# Patient Record
Sex: Female | Born: 1960 | Race: Black or African American | Hispanic: No | Marital: Single | Smoking: Current every day smoker
Health system: Southern US, Community
[De-identification: ages and names within clinical notes are randomized; demographics above are authoritative.]

## PROBLEM LIST (undated history)

## (undated) DIAGNOSIS — I209 Angina pectoris, unspecified: Secondary | ICD-10-CM

## (undated) DIAGNOSIS — J189 Pneumonia, unspecified organism: Secondary | ICD-10-CM

## (undated) DIAGNOSIS — R0602 Shortness of breath: Secondary | ICD-10-CM

## (undated) HISTORY — PX: COLON SURGERY: SHX602

---

## 1994-01-11 HISTORY — PX: SALPINGOOPHORECTOMY: SHX82

## 1999-08-19 ENCOUNTER — Emergency Department (HOSPITAL_COMMUNITY): Admission: EM | Admit: 1999-08-19 | Discharge: 1999-08-19 | Payer: Self-pay | Admitting: *Deleted

## 1999-09-02 ENCOUNTER — Emergency Department (HOSPITAL_COMMUNITY): Admission: EM | Admit: 1999-09-02 | Discharge: 1999-09-02 | Payer: Self-pay

## 1999-09-04 ENCOUNTER — Encounter: Payer: Self-pay | Admitting: Emergency Medicine

## 1999-09-04 ENCOUNTER — Inpatient Hospital Stay (HOSPITAL_COMMUNITY): Admission: EM | Admit: 1999-09-04 | Discharge: 1999-09-10 | Payer: Self-pay | Admitting: Emergency Medicine

## 1999-09-04 ENCOUNTER — Encounter (INDEPENDENT_AMBULATORY_CARE_PROVIDER_SITE_OTHER): Payer: Self-pay | Admitting: Specialist

## 1999-09-12 HISTORY — PX: APPENDECTOMY: SHX54

## 2003-06-14 ENCOUNTER — Inpatient Hospital Stay (HOSPITAL_COMMUNITY): Admission: EM | Admit: 2003-06-14 | Discharge: 2003-06-18 | Payer: Self-pay | Admitting: Emergency Medicine

## 2003-06-14 ENCOUNTER — Encounter: Payer: Self-pay | Admitting: Emergency Medicine

## 2006-02-13 ENCOUNTER — Emergency Department (HOSPITAL_COMMUNITY): Admission: EM | Admit: 2006-02-13 | Discharge: 2006-02-14 | Payer: Self-pay | Admitting: Emergency Medicine

## 2006-02-26 ENCOUNTER — Emergency Department (HOSPITAL_COMMUNITY): Admission: EM | Admit: 2006-02-26 | Discharge: 2006-02-26 | Payer: Self-pay | Admitting: Emergency Medicine

## 2006-09-06 ENCOUNTER — Emergency Department (HOSPITAL_COMMUNITY): Admission: EM | Admit: 2006-09-06 | Discharge: 2006-09-06 | Payer: Self-pay | Admitting: Emergency Medicine

## 2007-10-17 ENCOUNTER — Emergency Department (HOSPITAL_COMMUNITY): Admission: EM | Admit: 2007-10-17 | Discharge: 2007-10-18 | Payer: Self-pay | Admitting: Emergency Medicine

## 2007-12-14 ENCOUNTER — Emergency Department (HOSPITAL_COMMUNITY): Admission: EM | Admit: 2007-12-14 | Discharge: 2007-12-14 | Payer: Self-pay | Admitting: Emergency Medicine

## 2009-01-16 ENCOUNTER — Emergency Department (HOSPITAL_COMMUNITY): Admission: EM | Admit: 2009-01-16 | Discharge: 2009-01-16 | Payer: Self-pay | Admitting: Emergency Medicine
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
## 2010-05-29 NOTE — H&P (Signed)
[facility]  Patient:    Connie Rogers, Connie Rogers                  MRN: [id number] Adm. Date:  [id number] Attending:  Fortino Sic CC:         Willey Blade, M.D.   History and Physical  CHIEF COMPLAINT:  Abdominal pain.  HISTORY OF PRESENT ILLNESS:  This is a 50 year old female with abdominal pain of approximately four days, that is associated with nausea and vomiting.  The pain started in both lower quadrants and is basically on the right side and suprapubically.  There is marked anorexia with vomiting, some fevers, chills, and sweats.  CT was performed and reveals a 5 x 6 x 7 cm pelvic inflammatory mass, likely an abscess, cannot rule out appendiceal versus ovarian abscess. A small amount of free pelvic fluid.  Laboratory work reveals an elevated white count of 20,000, with a shift to the left.  PAST MEDICAL HISTORY:  The patient has had PID, thought to be due to gonorrhea, and has had a diagnosis of gonorrhea.  PAST SURGICAL HISTORY:  She has had a tubal pregnancy and a C-section.  SOCIAL HISTORY:  Cigarettes, one pack per day.  Alcohol daily, whiskey intake.  REVIEW OF SYSTEMS:  Otherwise negative.  FAMILY HISTORY:  Not contributory.  PHYSICAL EXAMINATION:  VITAL SIGNS:  Temperature 99.9, pulse 104, respirations 20, blood pressure 106/63.  GENERAL:  Well-developed, slightly obese, in moderate abdominal pain.  HEENT:  Normal.  NECK:  Supple.  CHEST:  Clear.  HEART:  Regular rhythm without murmurs, rubs, or gallops.  ABDOMEN:  Tender, particularly in the right lower quadrant and suprapubically. Rovsings sign is positive.  PELVIC:  Pelvic-rectal exam was performed by a gynecologist, and I did not repeat.  EXTREMITIES:  Within normal limits.  NEUROLOGIC:  Within normal limits.  SKIN:  Within normal limits.  ADMITTING IMPRESSION:  Pelvic abscess, rule out acute appendicitis. DD:  09/04/99 TD:  09/05/99 Job: [id number]

## 2010-05-29 NOTE — Op Note (Signed)
[facility]  Patient:    Connie Rogers, Connie Rogers                  MRN: [id number] Proc. Date: 08/31/99 Adm. Date:  [id number] Attending:  Donne Hazel                           Operative Report  PREOPERATIVE DIAGNOSIS:  Acute abdominal pain.  POSTOPERATIVE DIAGNOSIS:  Right tuboovarian abscess.  PROCEDURES: 1. Laparotomy. 2. Right salpingo-oophorectomy. 3. Appendectomy per Dr. Wiliam Ke.  SURGEON:  Willey Blade, M.D.  ASSISTANT:  Marnee Spring. Wiliam Ke, M.D.  ANESTHESIA:  General endotracheal.  ESTIMATED BLOOD LOSS:  100 cc.  COMPLICATIONS:  None.  FINDINGS:  At time of surgery, a large right tuboovarian complex was noted. This was about 7 cm in size and purulent.  There was purulence upon entry into the abdominal cavity.  The uterus was normal in size and configuration.  The left ovary was normal as well.  The left tube was absent.  DESCRIPTION OF PROCEDURE:  The patient was taken to the operating room where a general endotracheal anesthetic was administered.  The patient was placed on the operating table in the supine position.  The abdomen was prepped and draped in the usual sterile fashion with Betadine and sterile drapes.  A Foley catheter was sterilely inserted.  The abdomen was entered through a vertical incision extending from the suprapubic area to the umbilicus.  This was carried down sharply in the usual fashion.  The peritoneum was atraumatically entered.  Upon entry into the peritoneal cavity, purulence was encountered. First appendectomy was performed by Dr. Wiliam Ke.  There was, as mentioned, a large right tuboovarian abscess.  This was elevated into the incision, dissected with blunt dissection off the right pelvic sidewall in the posterior cul-de-sac.  The complex was elevated into the incision, and the mesosalpinx was clamped with Kelly clamps.  The infundibulopelvic ligament was also clamped with Kelly clamps and the abscess dissected  free sharply.  The infundibulopelvic ligament was ligated with a 0 chromic suture.  The mesosalpinx was also closed with multiple interrupted sutures of 0 chromic. the pelvis was then thoroughly irrigated with copious amounts of irrigant and noted to be hemostatic.  Attention was then turned to closure.  All packs and abdominal instruments were removed.  The rectus muscle was closed in a running fashion with two sutures of 0 PDS.  The subcutaneous tissue was irrigated and made hemostatic using Bovie cautery.  The skin reapproximated with staples, and a sterile dressing applied.  Final sponge, needle and instrument counts were correct x 3.  The patient will remain on antibiotics.  Blood loss was approximately 100 cc. DD:  09/04/99 TD:  09/06/99 Job: [id number]

## 2010-05-29 NOTE — Discharge Summary (Signed)
[facility]  Patient:    Connie Rogers, Connie Rogers                  MRN: [id number] Adm. Date:  [id number] Disc. Date: [id number] Attending:  Donne Hazel                           Discharge Summary  HISTORY OF PRESENT ILLNESS:  Connie Rogers is a 50 year old female admitted for acute abdominal pain in the right lower quadrant.  The patient underwent CT scan which showed a pelvic mass in the right lower quadrant.  She was admitted for surgical evaluation and therapy.  Upon admission, she did have a temperature and elevated white count of 20,000 with a positive left shift. She was admitted for surgical evaluation of this particular illness.  She did have peritoneal signs and a surgical abdomen.  PAST MEDICAL HISTORY: 1. History of PID. 2. History of gonorrhea.  PAST SURGICAL HISTORY: 1. History of left salpingectomy after tubal pregnancy. 2. Status post C-section.  SOCIAL HISTORY:  Positive for one-pack-per-day smoking.  The patient does drink significantly.  She denies any street drug usage.  OBSTETRICAL HISTORY: 1. Cesarean section x 1 with live birth. 2. Ectopic pregnancy with left salpingectomy in the past.  CURRENT MEDICATIONS:  None.  ALLERGIES:  None known.  PHYSICAL EXAMINATION:  Please see clinic admission history and physical.  ADMISSION DIAGNOSES: 1. Acute abdominal pain. 2. Rule out appendicitis. 3. Rule out tubo-ovarian abscess.  HOSPITAL COURSE:  The patient was admitted on same day of surgery on September 04, 1999, where she underwent an exploratory laparotomy through a midline incision.  The patient underwent an appendectomy.  Most notable during her surgery was a complex right tubo-ovarian abscess.  She underwent a right salpingo-oophorectomy due to the extent of her illness.  The surgery went well, and blood loss was 100 cc.  The patients postoperative course was remarkable for a very slow return to bowel function.  She  was placed on triple Unasyn and gentamicin postoperatively for infection control.  She became afebrile on postoperative day #2, and remained so throughout her hospitalization.  She did have a somewhat slow return to normal bowel function and required cathartics for this.  Her hemoglobin stabilized at 11.0.  Her last white count obtained was 8.8, with no left shift noted.  Her GC and chlamydia cultures were negative. She did have one lab showing slightly decreased potassium level and received some potassium supplementation for this.  On postoperative day #5, she felt much better.  She has been afebrile now for greater than 72 hours.  She did have a bowel movement on the day of discharge and is discharged routinely on postoperative day #5, in stable condition.  DISCHARGE DIAGNOSES: 1. Abdominal pain with right tubo-ovarian abscess, status post laparotomy    with right salpingo-oophorectomy. 2. Appendectomy. 3. Mild postoperative ileus, resolved. 4. Mild hypokalemia, treated.  PLAN: 1. Home. 2. Routine postoperative instructions given. 3. Follow up in the office in one to two weeks for a routine postoperative    check. 4. No heavy lifting or driving a car for two weeks. 5. Nothing in vagina for six weeks. 6. Tylox #40. 7. Doxycycline 100 mg p.o. b.i.d. x 7 days. 8. Postoperative instructions given in detail. DD:  09/10/99 TD:  09/11/99 Job: [id number]

## 2010-05-29 NOTE — Op Note (Signed)
[facility]  Patient:    Connie Rogers, Connie Rogers                  MRN: [id number] Proc. Date: 09/04/99 Adm. Date:  [id number] Attending:  Donne Hazel                           Operative Report  PREOPERATIVE DIAGNOSIS:  Pelvic abscess of unknown cause.  POSTOPERATIVE DIAGNOSIS:  Tuboovarian abscess.  OPERATIONS PERFORMED:  By Dr. Malachy Mood, right tube and ovary removal.  By Dr. Wiliam Ke, appendectomy.  DESCRIPTION OF PROCEDURE:  The abdomen was opened, and upon entering the abdomen, some pus was seen and cultured.  The uterus was mobilized, and an obvious TOA was found.  The cecum was mobilized, and the appendix was found. There was a great deal of serositis on the appendix, although we thought the appendix was involved with the inflammation and not the cause of the inflammation.  Because the appendix was down in the pelvis and there was so much serositis, we decided to proceed with appendectomy.  The appendiceal mesentery was doubly clamped, cut, and tied off with 0 silk.  The appendix was tied off with an 0 chromic tie and 2-0 silk tie.  The appendix was excised, and stump was cauterized with electrocautery.  Dr. Malachy Mood then went ahead and did the rest of the procedure and will dictate his note. DD:  09/04/99 TD:  09/06/99 Job: [id number]

## 2010-10-12 LAB — BASIC METABOLIC PANEL
BUN: 7
CO2: 25
Calcium: 9.1
Chloride: 107
Creatinine, Ser: 0.84
GFR calc Af Amer: >60
GFR calc non Af Amer: >60
Glucose, Bld: 98
Potassium: 3.4 — ABNORMAL LOW
Sodium: 141

## 2010-10-12 LAB — CBC
HCT: 33 — ABNORMAL LOW
Hemoglobin: 10.8 — ABNORMAL LOW
MCHC: 32.7
MCV: 81.1
Platelets: 353
RBC: 4.07
RDW: 20 — ABNORMAL HIGH
WBC: 8.3

## 2010-10-12 LAB — URINALYSIS, ROUTINE W REFLEX MICROSCOPIC
Glucose, UA: NEGATIVE
Ketones, ur: 15 — AB
Nitrite: NEGATIVE
Specific Gravity, Urine: 1.03
pH: 5.5

## 2010-10-12 LAB — DIFFERENTIAL
Basophils Absolute: 0.1
Basophils Relative: 1
Eosinophils Absolute: 0.1
Eosinophils Relative: 2
Lymphocytes Relative: 30
Lymphs Abs: 2.5
Monocytes Absolute: 0.5
Monocytes Relative: 6
Neutro Abs: 5.1
Neutrophils Relative %: 62

## 2010-10-12 LAB — ETHANOL: Alcohol, Ethyl (B): 16 — ABNORMAL HIGH

## 2010-10-12 LAB — RAPID URINE DRUG SCREEN, HOSP PERFORMED
Amphetamines: NOT DETECTED
Barbiturates: NOT DETECTED
Benzodiazepines: NOT DETECTED
Cocaine: POSITIVE — AB
Opiates: NOT DETECTED
Tetrahydrocannabinol: POSITIVE — AB

## 2010-10-12 LAB — URINE MICROSCOPIC-ADD ON

## 2010-10-16 LAB — URINALYSIS, ROUTINE W REFLEX MICROSCOPIC
Bilirubin Urine: NEGATIVE
Protein, ur: NEGATIVE mg/dL
Urobilinogen, UA: 0.2 mg/dL (ref 0.0–1.0)

## 2010-10-16 LAB — RPR: RPR Ser Ql: NONREACTIVE

## 2010-10-16 LAB — WET PREP, GENITAL: WBC, Wet Prep HPF POC: NONE SEEN

## 2010-10-16 LAB — POCT PREGNANCY, URINE: Preg Test, Ur: NEGATIVE

## 2010-10-23 LAB — POCT CARDIAC MARKERS
Myoglobin, poc: 48.8
Troponin i, poc: <0.05

## 2011-04-16 ENCOUNTER — Emergency Department (HOSPITAL_COMMUNITY)
Admission: EM | Admit: 2011-04-16 | Discharge: 2011-04-16 | Disposition: A | Discharge status: Home / Self Care | Payer: Self-pay | Attending: Emergency Medicine | Admitting: Emergency Medicine

## 2011-04-16 ENCOUNTER — Encounter (HOSPITAL_COMMUNITY): Payer: Self-pay | Admitting: *Deleted

## 2011-04-16 DIAGNOSIS — L0291 Cutaneous abscess, unspecified: Secondary | ICD-10-CM

## 2011-04-16 DIAGNOSIS — L0231 Cutaneous abscess of buttock: Secondary | ICD-10-CM | POA: Insufficient documentation

## 2011-04-16 DIAGNOSIS — F17200 Nicotine dependence, unspecified, uncomplicated: Secondary | ICD-10-CM | POA: Insufficient documentation

## 2011-04-16 DIAGNOSIS — L03317 Cellulitis of buttock: Secondary | ICD-10-CM | POA: Insufficient documentation

## 2011-04-16 MED ORDER — OXYCODONE-ACETAMINOPHEN 5-325 MG PO TABS
1.0000 | ORAL_TABLET | Freq: Once | ORAL | Status: AC
  Administered 2011-04-16: 1 via ORAL
  Filled 2011-04-16: qty 1

## 2011-04-16 MED ORDER — LIDOCAINE HCL (PF) 1 % IJ SOLN
5.0000 mL | Freq: Once | INTRAMUSCULAR | Status: AC
  Administered 2011-04-16: 5 mL via INTRADERMAL

## 2011-04-16 MED ORDER — OXYCODONE-ACETAMINOPHEN 5-325 MG PO TABS: 1.0000 | ORAL_TABLET | Freq: Four times a day (QID) | ORAL | Status: DC | PRN

## 2011-04-16 NOTE — Discharge Instructions (Signed)
Followup with your doctor or an urgent care in order to remove your packing in 48-72 hours. You may return to the emergency department if you have  a fever that persists greater than 101 or your abscess appears to become infected (growing surrounding redness and warmth). Do not operate any heavy machinery while on pain medications. Do not consume alcohol on these medications either. ° °Abscess °An abscess (boil or furuncle) is an infected area that contains a collection of pus.  °SYMPTOMS °Signs and symptoms of an abscess include pain, tenderness, redness, or hardness. You may feel a moveable soft area under your skin. An abscess can occur anywhere in the body.  °TREATMENT  °A surgical cut (incision) may be made over your abscess to drain the pus. Gauze may be packed into the space or a drain may be looped through the abscess cavity (pocket). This provides a drain that will allow the cavity to heal from the inside outwards. The abscess may be painful for a few days, but should feel much better if it was drained.  °Your abscess, if seen early, may not have localized and may not have been drained. If not, another appointment may be required if it does not get better on its own or with medications. °HOME CARE INSTRUCTIONS  °· Only take over-the-counter or prescription medicines for pain, discomfort, or fever as directed by your caregiver.  °· Take your antibiotics as directed if they were prescribed. Finish them even if you start to feel better.  °· Keep the skin and clothes clean around your abscess.  °· If the abscess was drained, you will need to use gauze dressing to collect any draining pus. Dressings will typically need to be changed 3 or more times a day.  °· The infection may spread by skin contact with others. Avoid skin contact as much as possible.  °· Practice good hygiene. This includes regular hand washing, cover any draining skin lesions, and do not share personal care items.  °· If you participate in  sports, do not share athletic equipment, towels, whirlpools, or personal care items. Shower after every practice or tournament.  °· If a draining area cannot be adequately covered:  °· Do not participate in sports.  °· Children should not participate in day care until the wound has healed or drainage stops.  °· If your caregiver has given you a follow-up appointment, it is very important to keep that appointment. Not keeping the appointment could result in a much worse infection, chronic or permanent injury, pain, and disability. If there is any problem keeping the appointment, you must call back to this facility for assistance.  °SEEK MEDICAL CARE IF:  °· You develop increased pain, swelling, redness, drainage, or bleeding in the wound site.  °· You develop signs of generalized infection including muscle aches, chills, fever, or a general ill feeling.  °· You have an oral temperature above 102° F (38.9° C).  °MAKE SURE YOU:  °· Understand these instructions.  °· Will watch your condition.  °· Will get help right away if you are not doing well or get worse.  °Document Released: 10/07/2004 Document Revised: 09/09/2010 Document Reviewed: 08/01/2007 °[employer]® Patient Information ©2012 [employer]. ° ° °

## 2011-04-16 NOTE — ED Notes (Signed)
Pt. C/o of wound in inner buttocks since early Februrary.  States that it burst and started bleeding. Reports alternating dark red and bright red blood that is "so heavy I thought I was on my period".  States she has used warm compresses without relief.

## 2011-04-16 NOTE — ED Notes (Signed)
To ed for eval of abscess on right inner buttock. Lanced in feb. Not healing and now bleeding.

## 2011-04-16 NOTE — ED Provider Notes (Signed)
History     CSN: [id number]  Arrival date & time 04/16/11  4098   First MD Initiated Contact with Patient 04/16/11 [phone]      Chief Complaint  Patient presents with  . Abscess    (Consider location/radiation/quality/duration/timing/severity/associated sxs/prior treatment) HPI Comments: Patient presents emergency department with chief complaint of abscess located in her right inner buttock.  The abscess was I&D back in February and has been bothersome sense.  Patient reports that it began to drain over the last couple of days and that the pain has gradually worsened.  Patient denies any incontinence or pain with bowel movements.  Patient denies fevers, night sweats, chills.  No other complaints at this time. Pt has no medical hx of diabetes, CA, HIV, steroid use.   Patient is a 51 y.o. female presenting with abscess. The history is provided by the patient.  Abscess  Pertinent negatives include no fever.    History reviewed. No pertinent past medical history.  History reviewed. No pertinent past surgical history.  History reviewed. No pertinent family history.  History  Substance Use Topics  . Smoking status: Current Everyday Smoker    Types: Cigarettes  . Smokeless tobacco: Not on file  . Alcohol Use: No    OB History    Grav Para Term Preterm Abortions TAB SAB Ect Mult Living                  Review of Systems  Constitutional: Negative for fever, chills, diaphoresis and activity change.       Denies night sweats  HENT: Negative for neck stiffness.   Eyes: Negative for visual disturbance.  Respiratory: Negative for shortness of breath.   Cardiovascular: Negative for chest pain.  Gastrointestinal: Negative for abdominal pain.  Genitourinary: Negative for dysuria, urgency and frequency.  Musculoskeletal: Negative for gait problem.  Skin: Negative for color change and rash.  Neurological: Negative for dizziness, light-headedness and headaches.  Hematological: Negative  for adenopathy.    Allergies  Review of patient's allergies indicates no known allergies.  Home Medications   Current Outpatient Rx  Name Route Sig Dispense Refill  . VITAMIN B-12 PO Oral Take 1 capsule by mouth daily.    Marland Kitchen FISH OIL PO Oral Take 1 capsule by mouth daily.    . OXYCODONE-ACETAMINOPHEN 5-325 MG PO TABS Oral Take 1 tablet by mouth every 6 (six) hours as needed for pain. 15 tablet 0    BP 124/82  Pulse 84  Temp 98.7 F (37.1 C)  SpO2 97%  Physical Exam  Nursing note and vitals reviewed. Constitutional: She is oriented to person, place, and time. She appears well-developed and well-nourished. She does not have a sickly appearance. She does not appear ill. No distress.  HENT:  Head: Normocephalic and atraumatic.  Eyes: Conjunctivae and EOM are normal.  Neck: Normal range of motion. Neck supple.  Cardiovascular: Normal rate and regular rhythm.   Pulmonary/Chest: Effort normal and breath sounds normal.  Genitourinary:          Abscess does NOT involve rectum  Chaperone was present. Patient with no pain around the rectal area. There are no external fissures noted. No induration of the skin or swelling. No external hemorrhoids seen. Patient able to tolerate examination. I was able to feel the first 2-3cm of the rectum digitally without gross abnormality. There is no gross blood.  No signs of perirectal abscess.    Musculoskeletal: She exhibits no edema.  Lymphadenopathy:  Head (right side): No submental, no preauricular and no posterior auricular adenopathy present.       Head (left side): No submental, no submandibular, no preauricular and no posterior auricular adenopathy present.    She has no axillary adenopathy.  Neurological: She is alert and oriented to person, place, and time.  Skin: Skin is warm and dry. No rash noted. She is not diaphoretic.       3cm sized abscess located on right inner buttock. Extreme tenderness to palpation. Draining mildly, blood  and prulent. Abscess is fluctuant without warmth, surrounding erythema, or induration.    ED Course  Procedures (including critical care time)  Labs Reviewed - No data to display No results found.  INCISION AND DRAINAGE Performed by: Jaci Carrel Consent: Verbal consent obtained. Risks and benefits: risks, benefits and alternatives were discussed Type: abscess  Body area: right groin/buttock  Anesthesia: local infiltration  Local anesthetic: lidocaine 1% no epinephrine  Anesthetic total: 2 ml  Complexity: complex Blunt dissection to break up loculations  Drainage: purulent bloody   Drainage amount: mild  Packing material: 1/4 in iodoform gauze  Patient tolerance: Patient tolerated the procedure well with no immediate complications.     1. Abscess       MDM  Abscess  Patient with skin abscess amenable to incision and drainage.  Abscess was large enough to warrant packing with removal and wound recheck in 2 days. No signs of cellulitis is surrounding skin.  Will d/c to home.  No antibiotic therapy is indicated.         Jaci Carrel, [place] 04/16/11 1312

## 2011-04-16 NOTE — ED Provider Notes (Signed)
Medical screening examination/treatment/procedure(s) were performed by non-physician practitioner and as supervising physician I was immediately available for consultation/collaboration.   Carleene Cooper III, MD 04/16/11 2034

## 2011-04-20 ENCOUNTER — Encounter (HOSPITAL_COMMUNITY): Payer: Self-pay | Admitting: *Deleted

## 2011-04-20 ENCOUNTER — Emergency Department (INDEPENDENT_AMBULATORY_CARE_PROVIDER_SITE_OTHER)
Admission: EM | Admit: 2011-04-20 | Discharge: 2011-04-20 | Disposition: A | Discharge status: Home / Self Care | Payer: Self-pay | Source: Home / Self Care | Attending: Family Medicine | Admitting: Family Medicine

## 2011-04-20 DIAGNOSIS — L0231 Cutaneous abscess of buttock: Secondary | ICD-10-CM

## 2011-04-20 MED ORDER — CHLORHEXIDINE GLUCONATE 4 % EX LIQD: 60.0000 mL | Freq: Every day | CUTANEOUS | Status: AC | PRN

## 2011-04-20 MED ORDER — DOXYCYCLINE HYCLATE 100 MG PO CAPS: 100.0000 mg | ORAL_CAPSULE | Freq: Two times a day (BID) | ORAL | Status: AC

## 2011-04-20 NOTE — ED Notes (Signed)
Pt is here for f/u of  4/5/2013I and D abscess between the buttocks .  She states the pain is much better, but she has some itching

## 2011-04-20 NOTE — Discharge Instructions (Signed)
Can clean with soap and water. Use the prescribed antibacterial soap weekly to prevent recurrent skin infections. Take the prescribed medications as instructed. Return if recurrent pain, swelling, drainage or redness despite following treatment.

## 2011-04-20 NOTE — ED Provider Notes (Signed)
History     CSN: [id number]  Arrival date & time 04/20/11  1606   First MD Initiated Contact with Patient 04/20/11 1624      Chief Complaint  Patient presents with  . Recurrent Skin Infections    (Consider location/radiation/quality/duration/timing/severity/associated sxs/prior treatment) HPI Comments: 51 year old obese, smoker nondiabetic female here for followup of left gluteal abscess status post incision and drainage on April 5 (4 days ago) at [facility] Emergency department. State her pain and swelling are much better still some purulent and blood drainage and itchiness. No fever or chills. Not taking any antibiotics. States she had this infection in the same place 2 months ago with spontaneous drainage. Then recurred few days before he was I&D on April 5. Has had some skin boils that have self resolved in the past. No cultures pending.   History reviewed. No pertinent past medical history.  Past Surgical History  Procedure Date  . Cesarean section   . Appendectomy   . Salpingoophorectomy     Family History  Problem Relation Age of Onset  . Diabetes Mother     History  Substance Use Topics  . Smoking status: Current Everyday Smoker    Types: Cigarettes  . Smokeless tobacco: Not on file  . Alcohol Use: No    OB History    Grav Para Term Preterm Abortions TAB SAB Ect Mult Living                  Review of Systems  Constitutional: Negative for fever and chills.  Gastrointestinal: Negative for nausea.  Skin:       As per HPI  Neurological: Negative for headaches.  All other systems reviewed and are negative.    Allergies  Review of patient's allergies indicates no known allergies.  Home Medications   Current Outpatient Rx  Name Route Sig Dispense Refill  . VITAMIN B-12 PO Oral Take 1 capsule by mouth daily.    Marland Kitchen FISH OIL PO Oral Take 1 capsule by mouth daily.    . CHLORHEXIDINE GLUCONATE 4 % EX LIQD Topical Apply 60 mLs (4 application total) topically daily  as needed. Can use weekly to prevent recurrent skin infections. 120 mL 0  . DOXYCYCLINE HYCLATE 100 MG PO CAPS Oral Take 1 capsule (100 mg total) by mouth 2 (two) times daily. 20 capsule 0    BP 127/82  Pulse 83  Temp(Src) 98.1 F (36.7 C) (Oral)  Resp 18  SpO2 95%  Physical Exam  Nursing note and vitals reviewed. Constitutional: She is oriented to person, place, and time. She appears well-developed and well-nourished. No distress.  Cardiovascular: Normal heart sounds.   Pulmonary/Chest: Breath sounds normal.  Neurological: She is alert and oriented to person, place, and time.  Skin:       Left gluteal area close to perineum: abscess s/p I&D. No dressing on top. Still central opening with minimal drainage. No erythema induration or tenderness around. No packing material found after exploration and irrigation. Dry dressing with antibiotic ointment applied after exam.    ED Course  Procedures (including critical care time)  Labs Reviewed - No data to display No results found.   1. Abscess of left buttock       MDM  Resolving left gluteal abscess still minimal drainage. No packing material removed today it probably fell spontaneously without patient being aware. History of recurrent infections prescribed doxycycline and recommended to use Hibiclens 4%. Weekly as needed. Asked to return if recurrent symptoms like  redness swelling tenderness or worsening discharge.        Sharin Grave, MD 04/23/11 [id number]

## 2011-05-07 ENCOUNTER — Observation Stay (HOSPITAL_COMMUNITY)
Admission: EM | Admit: 2011-05-07 | Discharge: 2011-05-08 | Disposition: A | Discharge status: Home / Self Care | Payer: Self-pay | Attending: Internal Medicine | Admitting: Internal Medicine

## 2011-05-07 ENCOUNTER — Emergency Department (HOSPITAL_COMMUNITY): Payer: Self-pay

## 2011-05-07 ENCOUNTER — Encounter (HOSPITAL_COMMUNITY): Payer: Self-pay | Admitting: *Deleted

## 2011-05-07 DIAGNOSIS — R002 Palpitations: Secondary | ICD-10-CM | POA: Diagnosis present

## 2011-05-07 DIAGNOSIS — R079 Chest pain, unspecified: Principal | ICD-10-CM | POA: Diagnosis present

## 2011-05-07 DIAGNOSIS — E785 Hyperlipidemia, unspecified: Secondary | ICD-10-CM | POA: Insufficient documentation

## 2011-05-07 DIAGNOSIS — R209 Unspecified disturbances of skin sensation: Secondary | ICD-10-CM | POA: Insufficient documentation

## 2011-05-07 DIAGNOSIS — Z23 Encounter for immunization: Secondary | ICD-10-CM | POA: Insufficient documentation

## 2011-05-07 DIAGNOSIS — R0602 Shortness of breath: Secondary | ICD-10-CM

## 2011-05-07 DIAGNOSIS — F17200 Nicotine dependence, unspecified, uncomplicated: Secondary | ICD-10-CM | POA: Insufficient documentation

## 2011-05-07 HISTORY — DX: Shortness of breath: R06.02

## 2011-05-07 HISTORY — DX: Pneumonia, unspecified organism: J18.9

## 2011-05-07 HISTORY — DX: Angina pectoris, unspecified: I20.9

## 2011-05-07 LAB — BASIC METABOLIC PANEL
CO2: 24 mEq/L (ref 19–32)
Chloride: 104 mEq/L (ref 96–112)
Creatinine, Ser: 0.69 mg/dL (ref 0.50–1.10)
Potassium: 3.8 mEq/L (ref 3.5–5.1)

## 2011-05-07 LAB — RAPID URINE DRUG SCREEN, HOSP PERFORMED
Barbiturates: NOT DETECTED
Benzodiazepines: NOT DETECTED
Cocaine: NOT DETECTED
Opiates: POSITIVE — AB
Tetrahydrocannabinol: NOT DETECTED

## 2011-05-07 LAB — CBC
HCT: 44.8 % (ref 36.0–46.0)
Hemoglobin: 15.7 g/dL — ABNORMAL HIGH (ref 12.0–15.0)
RDW: 12.8 % (ref 11.5–15.5)
WBC: 6.8 10*3/uL (ref 4.0–10.5)

## 2011-05-07 LAB — DIFFERENTIAL
Basophils Absolute: 0 10*3/uL (ref 0.0–0.1)
Lymphocytes Relative: 43 % (ref 12–46)
Monocytes Absolute: 0.3 10*3/uL (ref 0.1–1.0)
Neutro Abs: 3.4 10*3/uL (ref 1.7–7.7)
Neutrophils Relative %: 50 % (ref 43–77)

## 2011-05-07 LAB — POCT I-STAT TROPONIN I: Troponin i, poc: 0 ng/mL (ref 0.00–0.08)

## 2011-05-07 LAB — CARDIAC PANEL(CRET KIN+CKTOT+MB+TROPI)
Relative Index: 1.5 (ref 0.0–2.5)
Total CK: 275 U/L — ABNORMAL HIGH (ref 7–177)

## 2011-05-07 LAB — CK: Total CK: 234 U/L — ABNORMAL HIGH (ref 7–177)

## 2011-05-07 MED ORDER — VITAMIN B-12 100 MCG PO TABS
100.0000 ug | ORAL_TABLET | Freq: Every day | ORAL | Status: DC
  Administered 2011-05-08: 100 ug via ORAL
  Filled 2011-05-07: qty 1

## 2011-05-07 MED ORDER — ONDANSETRON HCL 4 MG/2ML IJ SOLN
4.0000 mg | Freq: Once | INTRAMUSCULAR | Status: AC
  Administered 2011-05-07: 4 mg via INTRAVENOUS
  Filled 2011-05-07: qty 2

## 2011-05-07 MED ORDER — MORPHINE SULFATE 4 MG/ML IJ SOLN
4.0000 mg | Freq: Once | INTRAMUSCULAR | Status: AC
  Administered 2011-05-07: 4 mg via INTRAVENOUS
  Filled 2011-05-07: qty 1

## 2011-05-07 MED ORDER — SODIUM CHLORIDE 0.9 % IV SOLN
INTRAVENOUS | Status: DC
  Administered 2011-05-07: 23:00:00 via INTRAVENOUS

## 2011-05-07 MED ORDER — FISH OIL 500 MG PO CAPS: 1.0000 | ORAL_CAPSULE | Freq: Every day | ORAL | Status: DC

## 2011-05-07 MED ORDER — OMEGA-3-ACID ETHYL ESTERS 1 G PO CAPS
1.0000 g | ORAL_CAPSULE | Freq: Every day | ORAL | Status: DC
  Administered 2011-05-08: 1 g via ORAL
  Filled 2011-05-07: qty 1

## 2011-05-07 MED ORDER — HYDROCODONE-ACETAMINOPHEN 5-325 MG PO TABS: 1.0000 | ORAL_TABLET | ORAL | Status: DC | PRN

## 2011-05-07 MED ORDER — NITROGLYCERIN 0.4 MG SL SUBL
0.4000 mg | SUBLINGUAL_TABLET | SUBLINGUAL | Status: DC | PRN
Start: 2011-05-07 — End: 2011-05-08

## 2011-05-07 MED ORDER — ASPIRIN EC 325 MG PO TBEC
325.0000 mg | DELAYED_RELEASE_TABLET | Freq: Every day | ORAL | Status: DC
  Administered 2011-05-07 – 2011-05-08 (×2): 325 mg via ORAL
  Filled 2011-05-07 (×2): qty 1

## 2011-05-07 MED ORDER — ASPIRIN 81 MG PO CHEW
324.0000 mg | CHEWABLE_TABLET | Freq: Once | ORAL | Status: AC
  Administered 2011-05-07: 324 mg via ORAL
  Filled 2011-05-07: qty 4

## 2011-05-07 MED ORDER — SODIUM CHLORIDE 0.9 % IV SOLN
Freq: Once | INTRAVENOUS | Status: AC
  Administered 2011-05-07: 12:00:00 via INTRAVENOUS

## 2011-05-07 MED ORDER — ENOXAPARIN SODIUM 40 MG/0.4ML ~~LOC~~ SOLN
40.0000 mg | SUBCUTANEOUS | Status: DC
  Administered 2011-05-07: 40 mg via SUBCUTANEOUS
  Filled 2011-05-07 (×2): qty 0.4

## 2011-05-07 MED ORDER — NICOTINE 21 MG/24HR TD PT24
21.0000 mg | MEDICATED_PATCH | Freq: Every day | TRANSDERMAL | Status: DC
  Administered 2011-05-07 – 2011-05-08 (×2): 21 mg via TRANSDERMAL
  Filled 2011-05-07 (×3): qty 1

## 2011-05-07 MED ORDER — PNEUMOCOCCAL VAC POLYVALENT 25 MCG/0.5ML IJ INJ
0.5000 mL | INJECTION | INTRAMUSCULAR | Status: AC
  Administered 2011-05-08: 0.5 mL via INTRAMUSCULAR
  Filled 2011-05-07: qty 0.5

## 2011-05-07 NOTE — H&P (Signed)
PCP:  Sheila Oats, MD, MD   DOA:  05/07/2011 10:48 AM  Chief Complaint:  -Chest pain x 1 day -Palpitations x 3 days  HPI: 51 y/o AA female with no significant past medical hx presented with palpitations with dizziness for past 3-4 days. Patient gives hx of palpitations off and on for past several years however for past few months this has been more frequent occuring few times a month and since last 3 days she has been experiencing it daily for several minutes and associated with some dizziness and diaphoresis. Patient again had these symptoms this morning at work and had numbness of her left foot and took leave from work to come to the ED. On her way she developed 7/10 substernal chest pain in the form of tightness which was on radiating and associated with palpitations. She felt nauseous as well bur denies vomiting. Denies SOB, headache or blurry vision. patient was given a dose of morphine and ASA in ED after which the pain improved to 4/10. She describes the pain to be aggravated with exertion. She informs being seen by her PCP in [place] few years in the past and recommended for outpatient Holter for her palpitations but could not afford due to her insurance. She had a stress test done about 3 years ago in [place] for chest pain and was normal. She informs being a drug addict and snorted cocaine until 4 years back and has nor recovered and has remained clean since then. denies fever, abdominal pain, bowel or urinary symptoms.  Allergies: No Known Allergies  Prior to Admission medications   Medication Sig Start Date End Date Taking? Authorizing Provider  Cyanocobalamin (VITAMIN B-12 PO) Take 1 capsule by mouth daily.   Yes Historical Provider, MD  Omega-3 Fatty Acids (FISH OIL) 500 MG CAPS Take 1 capsule by mouth daily.   Yes Historical Provider, MD    History reviewed. No pertinent past medical history.  Past Surgical History  Procedure Date  . Cesarean section   . Appendectomy     . Salpingoophorectomy     Social History:  reports that she has been smoking Cigarettes.  She does not have any smokeless tobacco history on file. She reports that she does not drink alcohol or use illicit drugs.  Family History  Problem Relation Age of Onset  . Diabetes Mother     Review of Systems:  Constitutional: Denies fever, chills, diaphoresis +, appetite change and fatigue.  HEENT: Denies photophobia, eye pain, redness, hearing loss, ear pain, congestion, sore throat, rhinorrhea, sneezing, mouth sores, trouble swallowing, neck pain, neck stiffness and tinnitus.   Respiratory: Denies SOB, DOE, cough, chest tightness,  and wheezing.   Cardiovascular:  chest pain, palpitations,  Denies leg swelling.  Gastrointestinal: nausea+, vomiting, abdominal pain, diarrhea, constipation, blood in stool and abdominal distention.  Genitourinary: Denies dysuria, urgency, frequency, hematuria, flank pain and difficulty urinating.  Musculoskeletal: Denies myalgias, back pain, joint swelling, arthralgias and gait problem.  Skin: Denies pallor, rash and wound.  Neurological:  dizziness, some numbness in foot ,denies seizures, syncope, weakness, light-headedness,  ,and headaches.  Hematological: Denies adenopathy. Easy bruising, personal or family bleeding history  Psychiatric/Behavioral: Denies suicidal ideation, mood changes, confusion, nervousness, sleep disturbance and agitation   Physical Exam:  Filed Vitals:   05/07/11 1215 05/07/11 1331 05/07/11 1345 05/07/11 1400  BP: 119/56 118/66 111/63 117/75  Pulse: 65 67 62 61  Temp:  98.6 F (37 C)    TempSrc:  Oral    Resp:  17 21 18 20   SpO2: 96% 97% 98% 96%    Constitutional: Vital signs reviewed.  Patient is a well-developed and well-nourished in no acute distress and cooperative with exam. Alert and oriented x3.  Head: Normocephalic and atraumatic Ear: TM normal bilaterally Mouth: no erythema or exudates, MMM Eyes: PERRL, EOMI,  conjunctivae normal, No scleral icterus.  Neck: Supple, Trachea midline normal ROM, No JVD, mass, thyromegaly, or carotid bruit present.  Cardiovascular: RRR, S1 normal, S2 normal, no MRG, pulses symmetric and intact bilaterally Pulmonary/Chest: CTAB, no wheezes, rales, or rhonchi Abdominal: Soft. Non-tender, non-distended, bowel sounds are normal, no masses, organomegaly, or guarding present.  GU: no CVA tenderness Musculoskeletal: No joint deformities, erythema, or stiffness, ROM full and no nontender Ext: no edema and no cyanosis, pulses palpable bilaterally (DP and PT) Hematology: no cervical, inginal, or axillary adenopathy.  Neurological: A&O x3, Strenght is normal and symmetric bilaterally, cranial nerve II-XII are grossly intact, no focal motor deficit, sensory intact to light touch bilaterally.  Skin: Warm, dry and intact. No rash, cyanosis, or clubbing.  Psychiatric: Normal mood and affect. speech and behavior is normal. Judgment and thought content normal. Cognition and memory are normal.   Labs on Admission:  Results for orders placed during the hospital encounter of 05/07/11 (from the past 48 hour(s))  CBC     Status: Abnormal   Collection Time   05/07/11 11:57 AM      Component Value Range Comment   WBC 6.8  4.0 - 10.5 (K/uL)    RBC 5.04  3.87 - 5.11 (MIL/uL)    Hemoglobin 15.7 (*) 12.0 - 15.0 (g/dL)    HCT 16.1  09.6 - 04.5 (%)    MCV 88.9  78.0 - 100.0 (fL)    MCH 31.2  26.0 - 34.0 (pg)    MCHC 35.0  30.0 - 36.0 (g/dL)    RDW 40.9  81.1 - 91.4 (%)    Platelets 245  150 - 400 (K/uL)   DIFFERENTIAL     Status: Normal   Collection Time   05/07/11 11:57 AM      Component Value Range Comment   Neutrophils Relative 50  43 - 77 (%)    Neutro Abs 3.4  1.7 - 7.7 (K/uL)    Lymphocytes Relative 43  12 - 46 (%)    Lymphs Abs 2.9  0.7 - 4.0 (K/uL)    Monocytes Relative 5  3 - 12 (%)    Monocytes Absolute 0.3  0.1 - 1.0 (K/uL)    Eosinophils Relative 2  0 - 5 (%)    Eosinophils  Absolute 0.1  0.0 - 0.7 (K/uL)    Basophils Relative 0  0 - 1 (%)    Basophils Absolute 0.0  0.0 - 0.1 (K/uL)   BASIC METABOLIC PANEL     Status: Normal   Collection Time   05/07/11 11:57 AM      Component Value Range Comment   Sodium 139  135 - 145 (mEq/L)    Potassium 3.8  3.5 - 5.1 (mEq/L)    Chloride 104  96 - 112 (mEq/L)    CO2 24  19 - 32 (mEq/L)    Glucose, Bld 96  70 - 99 (mg/dL)    BUN 13  6 - 23 (mg/dL)    Creatinine, Ser 7.82  0.50 - 1.10 (mg/dL)    Calcium 9.7  8.4 - 10.5 (mg/dL)    GFR calc non Af Amer >90  >90 (mL/min)  GFR calc Af Amer >90  >90 (mL/min)   POCT I-STAT TROPONIN I     Status: Normal   Collection Time   05/07/11 11:58 AM      Component Value Range Comment   Troponin i, poc 0.00  0.00 - 0.08 (ng/mL)    Comment 3              Radiological Exams on Admission: CXR : wnl  EKG: NSR , Prominent P waves ( P mitrale)  Assessment/Plan 51 y/o AA female with no significant past medial hx with several years of palpitations presents with increased palpitations with diaphoresis since past 3 days and substernal chest pain since 1 day.  Admitted to observation tele for r/o ACS    Chest pain Clinical hx not very typical but given hx of active smoking and dyslipidemia will r/o ACS with serial CE  ASA 325 mg daily  s/l nitro prn  monitor in tele Check TSH and urine tox If ruled out for ACS will get stress test which can be done as outpatient.  Palpitations  will get 2 D echo Monitor in tele and if unremarkable and symptomatically improved can be discharged with outpt Holter.  Continue remaining home meds  DVT prophylaxis  Diet: regular  counseled on smoking cessation   Full code Sherri Levenhagen 05/07/2011, 2:52 PM

## 2011-05-07 NOTE — ED Provider Notes (Signed)
History     CSN: [id number]  Arrival date & time 05/07/11  1046   First MD Initiated Contact with Patient 05/07/11 1107      Chief Complaint  Patient presents with  . Chest Pain    (Consider location/radiation/quality/duration/timing/severity/associated sxs/prior treatment) HPI Comments: Patient reports that she began having substernal chest pain earlier today while at work a few hours ago.  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  She reports that she has never had chest pain like this in the past.  She describes the pain as a pressure and reports that it does not radiate.  No prior cardiac history.  Risk factors for coronary disease include smoking.  Patient does not have a primary care provider at this time.  She reports that approximately 2 years ago in [place] she had a stress test which she reports was normal. She also reports that over the past 3 days she has been feeling occasional heart palpitations.  She reports that today her heart palpitations have been constant and associated with dizziness, lightheadedness, SOB, and nausea.  She is also feeling some numbness in both of her hands. Patient denies any use of estrogen containing medications, pain or swelling of LE, prior history of DVT/PE, prolonged travel or surgeries in the past 4 weeks.    Patient is a 51 y.o. female presenting with chest pain. The history is provided by the patient.  Chest Pain Primary symptoms include shortness of breath, palpitations, nausea and dizziness. Pertinent negatives for primary symptoms include no fever, no cough, no wheezing, no abdominal pain and no vomiting.  The palpitations also occurred with dizziness and shortness of breath. The palpitations did not occur with syncope.   Dizziness also occurs with nausea. Dizziness does not occur with vomiting.   Associated symptoms include numbness.     History reviewed. No pertinent past medical  history.  Past Surgical History  Procedure Date  . Cesarean section   . Appendectomy   . Salpingoophorectomy     Family History  Problem Relation Age of Onset  . Diabetes Mother     History  Substance Use Topics  . Smoking status: Current Everyday Smoker    Types: Cigarettes  . Smokeless tobacco: Not on file  . Alcohol Use: No    OB History    Grav Para Term Preterm Abortions TAB SAB Ect Mult Living                  Review of Systems  Constitutional: Negative for fever and chills.  HENT: Negative for neck pain and neck stiffness.   Respiratory: Positive for shortness of breath. Negative for cough and wheezing.   Cardiovascular: Positive for chest pain and palpitations. Negative for leg swelling.  Gastrointestinal: Positive for nausea. Negative for vomiting and abdominal pain.  Skin: Negative for rash.  Neurological: Positive for dizziness, light-headedness and numbness. Negative for syncope.    Allergies  Review of patient's allergies indicates no known allergies.  Home Medications   Current Outpatient Rx  Name Route Sig Dispense Refill  . VITAMIN B-12 PO Oral Take 1 capsule by mouth daily.    Marland Kitchen FISH OIL 500 MG PO CAPS Oral Take 1 capsule by mouth daily.      BP 120/83  Pulse 86  Temp(Src) 97.6 F (36.4 C) (Oral)  Resp 16  SpO2 96%  Physical Exam  Nursing note and vitals reviewed. Constitutional: She appears  well-developed and well-nourished. No distress.  HENT:  Head: Normocephalic and atraumatic.  Mouth/Throat: Oropharynx is clear and moist.  Cardiovascular: Normal rate, regular rhythm, normal heart sounds and intact distal pulses.        No lower extremity edema  Pulmonary/Chest: Effort normal and breath sounds normal. No respiratory distress. She has no wheezes. She has no rales. She exhibits no tenderness.  Abdominal: Soft. Bowel sounds are normal. She exhibits no distension. There is no tenderness.  Neurological: She is alert.  Skin: Skin is  warm and dry. No rash noted. She is not diaphoretic.  Psychiatric: She has a normal mood and affect.    ED Course  Procedures (including critical care time)   Labs Reviewed  CBC  DIFFERENTIAL  BASIC METABOLIC PANEL   No results found.   No diagnosis found.   Date: 05/07/2011  Rate: 77  Rhythm: sinus arrhythmia  QRS Axis: normal  Intervals: normal  ST/T Wave abnormalities: normal  Conduction Disutrbances:none  Narrative Interpretation:   Old EKG Reviewed: unchanged   Patient discussed with Dr. Juleen China who also evaluated patient.  12:30 PM Reassessed patient.  VSS.  Patient reports that her chest pain has resolved.  No acute distress.  Patient discussed with Dr. Gonzella Lex with [facility].  He has agreed to admit patient for Chest pain rule out.     MDM  Patient with new onset substernal chest pain associated with nausea, diaphoresis, lightheadedness, palpitations, and some SOB.  Pain resolved after given Morphine.  Negative initial troponin, negative CXR, no acute changes on EKG.  However, will admit to hospital for further evaluation. Concern for ACS and patient does not have good follow up care.          Pascal Lux [place], PA-C 05/07/11 1459

## 2011-05-07 NOTE — ED Notes (Addendum)
Pt c/o non radiating mid-sternum chest pain, dizziness, sob, weakness, nausea, and diaphoresis x3 days. Pt reports she works at a [employer] and her chest pain started while performing her daily activities at work. Pain increases w/activity and decreases w/rest. Pt also reports (R) foot numbness

## 2011-05-07 NOTE — ED Notes (Signed)
Pt reports mid sternal non radiating chest pain intermittent x 3 days associated with nausea, sob, and diaphoresis.

## 2011-05-08 LAB — TSH: TSH: 4.964 u[IU]/mL — ABNORMAL HIGH (ref 0.350–4.500)

## 2011-05-08 LAB — CARDIAC PANEL(CRET KIN+CKTOT+MB+TROPI)
Relative Index: 1.6 (ref 0.0–2.5)
Troponin I: <0.3 ng/mL (ref <0.30)

## 2011-05-08 MED ORDER — NICOTINE 21 MG/24HR TD PT24: 1.0000 | MEDICATED_PATCH | Freq: Every day | TRANSDERMAL | Status: AC

## 2011-05-08 NOTE — Discharge Summary (Addendum)
Patient ID: Connie Rogers MRN: [id number] DOB/AGE: [birth year] 51 y.o.  Admit date: 05/07/2011 Discharge date: 05/08/2011  Primary Care Physician:  Connie Oats, MD, MD  Discharge Diagnoses:    Present on Admission:  .Chest pain likely atypical ( musculoskeletal vs anxiety induced) .Heart palpitations    Medication List  As of 05/08/2011  2:08 PM   STOP taking these medications         FISH OIL PO         TAKE these medications         Fish Oil 500 MG Caps   Take 1 capsule by mouth daily.      VITAMIN B-12 PO   Take 1 capsule by mouth daily.            Disposition and Follow-up:  Home with follow up with [facility] in 1 week 9 office will call patient )  Consults:  none  Significant Diagnostic Studies:  Dg Chest 2 View  05/07/2011  *RADIOLOGY REPORT*  Clinical Data: Palpitations.  Chest pain.  Shortness of breath.  CHEST - 2 VIEW  Comparison: 02/26/2006  Findings: Improved aeration of both lungs is seen since previous study.  Both lungs are clear.  No evidence of pleural effusion. Heart size is normal.  No mass or lymphadenopathy identified.  IMPRESSION: No active cardiopulmonary disease.  Original Report Authenticated By: [name], M.D.    Brief H and P: For complete details please refer to admission H and P, but in brief 51 y/o AA female with no significant past medical hx presented with palpitations with dizziness for past 3-4 days. Patient gives hx of palpitations off and on for past several years however for past few months this has been more frequent occuring few times a month and since last 3 days she has been experiencing it daily for several minutes and associated with some dizziness and diaphoresis. Patient again had these symptoms this morning at work and had numbness of her left foot and took leave from work to come to the ED. On her way she developed 7/10 substernal chest pain in the form of tightness which was on radiating and associated with  palpitations. She felt nauseous as well bur denies vomiting. Denies SOB, headache or blurry vision. patient was given a dose of morphine and ASA in ED after which the pain improved to 4/10. She describes the pain to be aggravated with exertion. She informs being seen by her PCP in [place] few years in the past and recommended for outpatient Holter for her palpitations but could not afford due to her insurance. She had a stress test done about 3 years ago in [place] for chest pain and was normal. She informs being a drug addict and snorted cocaine until 4 years back and has nor recovered and has remained clean since then. denies fever, abdominal pain, bowel or urinary symptoms.   Physical Exam on Discharge:  Filed Vitals:   05/07/11 1602 05/07/11 1605 05/07/11 2100 05/08/11 0610  BP: 137/95 140/89 130/61 125/76  Pulse: 62 62 61 63  Temp:   98.1 F (36.7 C) 97.7 F (36.5 C)  TempSrc:   Oral Oral  Resp:   18 18  Height:      Weight:      SpO2:   99% 98%    No intake or output data in the 24 hours ending 05/08/11 1408  General: Alert, awake, oriented x3, in no acute distress. HEENT: No bruits, no goiter. Heart: Regular  rate and rhythm, without murmurs, rubs, gallops. Lungs: Clear to auscultation bilaterally. Abdomen: Soft, nontender, nondistended, positive bowel sounds. Extremities: No clubbing cyanosis or edema with positive pedal pulses. Neuro: Grossly intact, nonfocal.  CBC:    Component Value Date/Time   WBC 6.8 05/07/2011 1157   HGB 15.7* 05/07/2011 1157   HCT 44.8 05/07/2011 1157   PLT 245 05/07/2011 1157   MCV 88.9 05/07/2011 1157   NEUTROABS 3.4 05/07/2011 1157   LYMPHSABS 2.9 05/07/2011 1157   MONOABS 0.3 05/07/2011 1157   EOSABS 0.1 05/07/2011 1157   BASOSABS 0.0 05/07/2011 1157    Basic Metabolic Panel:    Component Value Date/Time   NA 139 05/07/2011 1157   K 3.8 05/07/2011 1157   CL 104 05/07/2011 1157   CO2 24 05/07/2011 1157   BUN 13 05/07/2011 1157   CREATININE 0.69  05/07/2011 1157   GLUCOSE 96 05/07/2011 1157   CALCIUM 9.7 05/07/2011 1157    Hospital Course:  Chest pain and palpitations Clinical hx not very typical but given hx of active smoking and dyslipidemia patient was admitted under observation for ruling out  ACS with serial CE and EKG that were negative utox negative  TSH mildly elevated with normal Free T4. i would not treat this at this time and should get a repeat level checked in about 6-8 weeks. She does not have symptoms of  hypothyroidism Check TSH and urine tox    She did have a low BP on presentation and improved with hydration.  Patient is symptom free stable on tele and can be discharged home . She will be followed by [facility] in 1 week and evaluated for stress test and or  holter monitoring  Patient counseled strongly on smoking cessation  Time spent on Discharge: 25 minutes  Signed: [name] 05/08/2011, 2:08 PM

## 2011-05-11 NOTE — ED Provider Notes (Signed)
Medical screening examination/treatment/procedure(s) were conducted as a shared visit with non-physician practitioner(s) and myself.  I personally evaluated the patient during the encounter.  51 year old female with chest pain. Somewhat concerning story for angina. Patient with risk factors. Will admit to hospital for rule out  Raeford Razor, MD 05/11/11 2148

## 2011-07-12 ENCOUNTER — Encounter (HOSPITAL_COMMUNITY): Payer: Self-pay | Admitting: Emergency Medicine

## 2011-07-12 ENCOUNTER — Emergency Department (HOSPITAL_COMMUNITY)
Admission: EM | Admit: 2011-07-12 | Discharge: 2011-07-12 | Disposition: A | Discharge status: Home / Self Care | Payer: Self-pay | Attending: Emergency Medicine | Admitting: Emergency Medicine

## 2011-07-12 ENCOUNTER — Emergency Department (HOSPITAL_COMMUNITY): Payer: Self-pay

## 2011-07-12 DIAGNOSIS — F17200 Nicotine dependence, unspecified, uncomplicated: Secondary | ICD-10-CM | POA: Insufficient documentation

## 2011-07-12 DIAGNOSIS — R079 Chest pain, unspecified: Secondary | ICD-10-CM | POA: Insufficient documentation

## 2011-07-12 DIAGNOSIS — J40 Bronchitis, not specified as acute or chronic: Secondary | ICD-10-CM

## 2011-07-12 DIAGNOSIS — R0602 Shortness of breath: Secondary | ICD-10-CM | POA: Insufficient documentation

## 2011-07-12 DIAGNOSIS — R05 Cough: Secondary | ICD-10-CM | POA: Insufficient documentation

## 2011-07-12 DIAGNOSIS — R059 Cough, unspecified: Secondary | ICD-10-CM | POA: Insufficient documentation

## 2011-07-12 DIAGNOSIS — R42 Dizziness and giddiness: Secondary | ICD-10-CM | POA: Insufficient documentation

## 2011-07-12 LAB — CARDIAC PANEL(CRET KIN+CKTOT+MB+TROPI)
CK, MB: 3.7 ng/mL (ref 0.3–4.0)
Relative Index: 1.5 (ref 0.0–2.5)
Troponin I: <0.3 ng/mL (ref <0.30)

## 2011-07-12 LAB — CBC WITH DIFFERENTIAL/PLATELET
Eosinophils Absolute: 0.1 10*3/uL (ref 0.0–0.7)
Hemoglobin: 15.4 g/dL — ABNORMAL HIGH (ref 12.0–15.0)
Lymphocytes Relative: 52 % — ABNORMAL HIGH (ref 12–46)
Lymphs Abs: 3.2 10*3/uL (ref 0.7–4.0)
MCH: 30.9 pg (ref 26.0–34.0)
MCV: 90.6 fL (ref 78.0–100.0)
Monocytes Relative: 4 % (ref 3–12)
Neutrophils Relative %: 41 % — ABNORMAL LOW (ref 43–77)
Platelets: 238 10*3/uL (ref 150–400)
RBC: 4.99 MIL/uL (ref 3.87–5.11)
WBC: 6.1 10*3/uL (ref 4.0–10.5)

## 2011-07-12 LAB — BASIC METABOLIC PANEL
BUN: 16 mg/dL (ref 6–23)
CO2: 21 mEq/L (ref 19–32)
Glucose, Bld: 112 mg/dL — ABNORMAL HIGH (ref 70–99)
Potassium: 3.7 mEq/L (ref 3.5–5.1)
Sodium: 140 mEq/L (ref 135–145)

## 2011-07-12 LAB — D-DIMER, QUANTITATIVE: D-Dimer, Quant: 0.43 ug/mL-FEU (ref 0.00–0.48)

## 2011-07-12 MED ORDER — ALBUTEROL SULFATE HFA 108 (90 BASE) MCG/ACT IN AERS: 1.0000 | INHALATION_SPRAY | Freq: Four times a day (QID) | RESPIRATORY_TRACT | Status: DC | PRN

## 2011-07-12 MED ORDER — ALBUTEROL SULFATE HFA 108 (90 BASE) MCG/ACT IN AERS
2.0000 | INHALATION_SPRAY | Freq: Once | RESPIRATORY_TRACT | Status: AC
  Administered 2011-07-12: 2 via RESPIRATORY_TRACT
  Filled 2011-07-12: qty 6.7

## 2011-07-12 MED ORDER — AZITHROMYCIN 250 MG PO TABS: 250.0000 mg | ORAL_TABLET | Freq: Every day | ORAL | Status: AC

## 2011-07-12 NOTE — ED Notes (Signed)
Pt back from XR. Respiratory called for albuterol tx

## 2011-07-12 NOTE — Discharge Instructions (Signed)

## 2011-07-12 NOTE — ED Notes (Signed)
MD in room at this time. Pt c/o midsternal CP with deep breathing and pain to touch. Pt current everyday smoker until today, states that she stopped smoking this AM and is going to try the patch.

## 2011-07-12 NOTE — ED Notes (Signed)
PT pulse ox stayed at 97% while walking.

## 2011-07-12 NOTE — ED Notes (Signed)
Pt c/o SOB and pain in chest with movement, inspiration and cough x 2 days; pt sts head congestion

## 2011-07-12 NOTE — ED Provider Notes (Signed)
History     CSN: [id number]  Arrival date & time 07/12/11  0915   First MD Initiated Contact with Patient 07/12/11 [phone]      Chief Complaint  Patient presents with  . Chest Pain  . Shortness of Breath    (Consider location/radiation/quality/duration/timing/severity/associated sxs/prior treatment) HPI Comments: Obese female presenting with shortness of breath, cough, pain with inspiration in the center of her chest for the past 2 days. She has no chest pain unless she tries to take a deep breath or move a certain way. She denies any fever, abdominal pain, nausea or vomiting. Cough productive of white sputum with brown flecks.  No sick contacts. Had admission in April for atypical chest pain, different than today's pain.  Last stress test 3 years ago.  The history is provided by the patient.    Past Medical History  Diagnosis Date  . Angina   . Pneumonia ~ 2003  . Shortness of breath on exertion 05/07/11    "all the time"    Past Surgical History  Procedure Date  . Salpingoophorectomy 1996  . Cesarean section 1995  . Appendectomy 09/1999    Family History  Problem Relation Age of Onset  . Diabetes Mother     History  Substance Use Topics  . Smoking status: Current Everyday Smoker -- 0.5 packs/day for 35 years    Types: Cigarettes  . Smokeless tobacco: Never Used  . 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  HENT: Positive for congestion and rhinorrhea. Negative for sore throat and trouble swallowing.   Eyes: Negative for visual disturbance.  Respiratory: Positive for cough and shortness of breath. Negative for chest tightness.   Cardiovascular: Positive for chest pain.  Gastrointestinal: Negative for nausea, vomiting and abdominal pain.  Genitourinary: Negative  for dysuria, hematuria, vaginal bleeding and vaginal discharge.  Musculoskeletal: Negative for back pain.  Skin: Negative for rash.  Neurological: Positive for headaches.    Allergies  Review of patient's allergies indicates no known allergies.  Home Medications   Current Outpatient Rx  Name Route Sig Dispense Refill  . VITAMIN B-12 PO Oral Take 1 capsule by mouth daily.    Marland Kitchen NICOTINE 21 MG/24HR TD PT24 Transdermal Place 1 patch onto the skin daily.    Marland Kitchen FISH OIL 500 MG PO CAPS Oral Take 1 capsule by mouth daily.      BP 115/70  Pulse 74  Temp 98.3 F (36.8 C) (Oral)  Resp 20  SpO2 98%  Physical Exam  Constitutional: She is oriented to person, place, and time. She appears well-developed and well-nourished. No distress.  HENT:  Head: Normocephalic and atraumatic.  Mouth/Throat: Oropharynx is clear and moist. No oropharyngeal exudate.  Eyes: Conjunctivae are normal. Pupils are equal, round, and reactive to light.  Neck: Normal range of motion.  Cardiovascular: Normal rate, regular rhythm and normal heart sounds.   No murmur heard. Pulmonary/Chest: Effort normal and breath sounds normal. No respiratory distress. She exhibits tenderness.       Reproducible chest wall tenderness  Abdominal: Soft. There is no tenderness. There is no rebound and no guarding.  Musculoskeletal: Normal range of motion. She exhibits no edema and  no tenderness.  Neurological: She is alert and oriented to person, place, and time. No cranial nerve deficit.  Skin: Skin is warm.    ED Course  Procedures (including critical care time)  Labs Reviewed  CBC WITH DIFFERENTIAL - Abnormal; Notable for the following:    Hemoglobin 15.4 (*)     Neutrophils Relative 41 (*)     Lymphocytes Relative 52 (*)     All other components within normal limits  BASIC METABOLIC PANEL - Abnormal; Notable for the following:    Glucose, Bld 112 (*)     All other components within normal limits  CARDIAC PANEL(CRET  KIN+CKTOT+MB+TROPI) - Abnormal; Notable for the following:    Total CK 246 (*)     All other components within normal limits  D-DIMER, QUANTITATIVE   Dg Chest 2 View  07/12/2011  *RADIOLOGY REPORT*  Clinical Data: Chest pain, shortness of breath  CHEST - 2 VIEW  Comparison: 05/07/2011  Findings: Mild bronchitic changes.  Lungs otherwise clear.  No pleural effusion or pneumothorax.  Cardiomediastinal silhouette is within normal limits.  Mild degenerative changes of the visualized thoracolumbar spine.  IMPRESSION: No evidence of acute cardiopulmonary disease.  Original Report Authenticated By: Charline Bills, M.D.     No diagnosis found.    MDM  Cough, shortness of breath, chest pain with inspiration and movement. Atypical for ACS. Bronchitis versus pneumonia versus PE  Chest x-ray negative. D-dimer negative. EKG nonischemic. Patient able to her in the ED without desaturation. We'll treat for bronchitis with inhaler and antibiotics.   Date: 07/12/2011  Rate: 78  Rhythm: normal sinus rhythm  QRS Axis: normal  Intervals: normal  ST/T Wave abnormalities: normal  Conduction Disutrbances:none  Narrative Interpretation:   Old EKG Reviewed: unchanged          Glynn Octave, MD 07/12/11 1316

## 2012-01-13 ENCOUNTER — Emergency Department (HOSPITAL_COMMUNITY): Payer: No Typology Code available for payment source

## 2012-01-13 ENCOUNTER — Encounter (HOSPITAL_COMMUNITY): Payer: Self-pay | Admitting: *Deleted

## 2012-01-13 ENCOUNTER — Emergency Department (HOSPITAL_COMMUNITY)
Admission: EM | Admit: 2012-01-13 | Discharge: 2012-01-13 | Disposition: A | Discharge status: Home / Self Care | Payer: No Typology Code available for payment source | Attending: Emergency Medicine | Admitting: Emergency Medicine

## 2012-01-13 DIAGNOSIS — R079 Chest pain, unspecified: Secondary | ICD-10-CM | POA: Insufficient documentation

## 2012-01-13 DIAGNOSIS — E860 Dehydration: Secondary | ICD-10-CM | POA: Insufficient documentation

## 2012-01-13 DIAGNOSIS — Z8679 Personal history of other diseases of the circulatory system: Secondary | ICD-10-CM | POA: Insufficient documentation

## 2012-01-13 DIAGNOSIS — R5381 Other malaise: Secondary | ICD-10-CM | POA: Insufficient documentation

## 2012-01-13 DIAGNOSIS — Z8701 Personal history of pneumonia (recurrent): Secondary | ICD-10-CM | POA: Insufficient documentation

## 2012-01-13 DIAGNOSIS — E785 Hyperlipidemia, unspecified: Secondary | ICD-10-CM | POA: Insufficient documentation

## 2012-01-13 DIAGNOSIS — F17200 Nicotine dependence, unspecified, uncomplicated: Secondary | ICD-10-CM | POA: Insufficient documentation

## 2012-01-13 DIAGNOSIS — R0602 Shortness of breath: Secondary | ICD-10-CM | POA: Insufficient documentation

## 2012-01-13 LAB — COMPREHENSIVE METABOLIC PANEL
ALT: 22 U/L (ref 0–35)
AST: 18 U/L (ref 0–37)
Alkaline Phosphatase: 101 U/L (ref 39–117)
CO2: 21 mEq/L (ref 19–32)
Calcium: 9.4 mg/dL (ref 8.4–10.5)
Chloride: 106 mEq/L (ref 96–112)
GFR calc non Af Amer: >90 mL/min (ref >90)
Potassium: 3.9 mEq/L (ref 3.5–5.1)
Sodium: 139 mEq/L (ref 135–145)

## 2012-01-13 LAB — CBC WITH DIFFERENTIAL/PLATELET
Basophils Absolute: 0 10*3/uL (ref 0.0–0.1)
Eosinophils Relative: 3 % (ref 0–5)
Lymphocytes Relative: 47 % — ABNORMAL HIGH (ref 12–46)
Lymphs Abs: 3.2 10*3/uL (ref 0.7–4.0)
Neutro Abs: 3.1 10*3/uL (ref 1.7–7.7)
Neutrophils Relative %: 46 % (ref 43–77)
Platelets: 228 10*3/uL (ref 150–400)
RBC: 4.87 MIL/uL (ref 3.87–5.11)
RDW: 12.8 % (ref 11.5–15.5)
WBC: 6.9 10*3/uL (ref 4.0–10.5)

## 2012-01-13 MED ORDER — SODIUM CHLORIDE 0.9 % IV BOLUS (SEPSIS)
1000.0000 mL | Freq: Once | INTRAVENOUS | Status: AC
  Administered 2012-01-13: 1000 mL via INTRAVENOUS

## 2012-01-13 MED ORDER — METOCLOPRAMIDE HCL 5 MG/ML IJ SOLN
10.0000 mg | Freq: Once | INTRAMUSCULAR | Status: AC
  Administered 2012-01-13: 10 mg via INTRAVENOUS
  Filled 2012-01-13: qty 2

## 2012-01-13 NOTE — ED Notes (Signed)
EMS-pt works at [employer] she was walking to the kitchen when she suddenly felt dizzy, sob, and had mild midsternal chest pain. At this time pt is chest pain free and denies sob. Pt states she has had an intermittent headache x 1 week and has had tingling to both arms intermittently. Pt was given 324 ASA. Vitals WNL. 20g(R)hand.

## 2012-01-13 NOTE — ED Notes (Signed)
Pt c/o constant h/a to left side head x 11 days. Prod cough x 1 month, denies fever, n/v. Today while walking reports had a brief episode of lightheadedness, dizziness, chest tightness, SOB lasting approx 5 min. Presently denies all symptoms except for h/a.  Denied dizziness while obtaining orthostatic vital signs.

## 2012-01-13 NOTE — ED Notes (Signed)
Patient transported to CT/Xray. 

## 2012-01-13 NOTE — ED Provider Notes (Addendum)
History     CSN: [id number]  Arrival date & time 01/13/12  1606   First MD Initiated Contact with Patient 01/13/12 1612      Chief Complaint  Patient presents with  . Dizziness  . Chest Pain  . Shortness of Breath  . Migraine    (Consider location/radiation/quality/duration/timing/severity/associated sxs/prior treatment) The history is provided by the patient.  Connie Rogers is a 52 y.o. female history of pneumonia, hyperlipidemia here with chest pain dizziness and presyncope. She works in a [employer] and today she had some chest pain and shortness of breath and subsequently felt like passing out. She didn't actually pass out. She was given aspirin 324 by EMS and now is pain-free. She also has intermittent headaches for the last week and intermittent tingling down both arms. Denies any weakness or numbness.    Past Medical History  Diagnosis Date  . Angina   . Pneumonia ~ 2003  . Shortness of breath on exertion 05/07/11    "all the time"    Past Surgical History  Procedure Date  . Salpingoophorectomy 1996  . Cesarean section 1995  . Appendectomy 09/1999    Family History  Problem Relation Age of Onset  . Diabetes Mother     History  Substance Use Topics  . Smoking status: Current Every Day Smoker -- 0.5 packs/day for 35 years    Types: Cigarettes  . Smokeless tobacco: Never Used  . 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   Cardiovascular: Positive for chest pain.  Neurological: Positive for dizziness and headaches.  All other systems reviewed and are negative.    Allergies  Review of patient's allergies indicates no known allergies.  Home Medications   Current Outpatient Rx  Name  Route  Sig  Dispense  Refill  . IBUPROFEN 200 MG PO TABS   Oral   Take 600 mg by  mouth every 6 (six) hours as needed. For pain           BP 115/76  Pulse 67  Temp 97.6 F (36.4 C) (Oral)  Resp 19  SpO2 100%  Physical Exam  Nursing note and vitals reviewed. Constitutional: She is oriented to person, place, and time. She appears well-developed and well-nourished.       NAD, comfortable   HENT:  Head: Normocephalic.  Mouth/Throat: Oropharynx is clear and moist.  Eyes: Conjunctivae normal are normal. Pupils are equal, round, and reactive to light.  Neck: Normal range of motion. Neck supple.  Cardiovascular: Normal rate, regular rhythm and normal heart sounds.   Pulmonary/Chest: Effort normal and breath sounds normal. No respiratory distress. She has no wheezes. She has no rales.  Abdominal: Soft. Bowel sounds are normal. She exhibits no distension. There is no tenderness. There is no rebound.  Musculoskeletal: Normal range of motion. She exhibits no edema and no tenderness.  Neurological: She is alert and oriented to person, place, and time.       5/5 strength and sensation throughout.   Skin: Skin is warm and dry.  Psychiatric: She has a normal mood and affect. Her behavior is normal. Judgment and thought content normal.    ED Course  Procedures (including critical  care time)  Labs Reviewed  CBC WITH DIFFERENTIAL - Abnormal; Notable for the following:    Hemoglobin 15.2 (*)     Lymphocytes Relative 47 (*)     All other components within normal limits  COMPREHENSIVE METABOLIC PANEL - Abnormal; Notable for the following:    Albumin 3.3 (*)     Total Bilirubin 0.1 (*)     All other components within normal limits  TROPONIN I   Dg Chest 2 View  01/13/2012  *RADIOLOGY REPORT*  Clinical Data: Dizziness with chest pain and shortness of breath. Migraine headache.  CHEST - 2 VIEW  Comparison: 07/12/2011 and 05/07/2011 radiographs.  Findings: The heart size and mediastinal contours are stable.  The lungs appear stable with chronic central airway thickening.   There is no hyperinflation, confluent airspace opacity or pleural effusion.  The osseous structures appear unchanged.  IMPRESSION: Stable examination with evidence of chronic bronchitis.  No acute cardiopulmonary process.   Original Report Authenticated By: Carey Bullocks, M.D.      No diagnosis found.   Date: 01/13/2012  Rate: *63  Rhythm: normal sinus rhythm  QRS Axis: normal  Intervals: normal  ST/T Wave abnormalities: normal  Conduction Disutrbances:none  Narrative Interpretation:   Old EKG Reviewed: unchanged    MDM  Connie Rogers is a 52 y.o. female here with CP, lightheadedness, headaches. I think she may be dehydrated so will get orthostatics. She is obese and low risk for CAD (only risk factor is smoking). Will get trop x 2. Will also get basic lab work and reassess.   7:15 PM Patient hypotensive initially but not orthostatic. BP improved with fluids. Labs unremarkable, CXR nl. Headache resolved with meds. CT head nl per prelim read. Patient doesn't want to stay for second set troponin and rather f/u with pmd. She had atypical chest pain and has been pain free in the ED. I told her to come back if pain worsen.       Richardean Canal, MD 01/13/12 1920  Richardean Canal, MD 01/13/12 Norberta Keens

## 2012-01-28 ENCOUNTER — Other Ambulatory Visit: Payer: Self-pay | Admitting: Family Medicine

## 2012-01-28 ENCOUNTER — Other Ambulatory Visit (HOSPITAL_COMMUNITY)
Admission: RE | Admit: 2012-01-28 | Discharge: 2012-01-28 | Disposition: A | Discharge status: Home / Self Care | Payer: No Typology Code available for payment source | Source: Ambulatory Visit | Attending: Family Medicine | Admitting: Family Medicine

## 2012-01-28 DIAGNOSIS — Z01419 Encounter for gynecological examination (general) (routine) without abnormal findings: Secondary | ICD-10-CM | POA: Insufficient documentation

## 2012-02-07 ENCOUNTER — Other Ambulatory Visit (HOSPITAL_COMMUNITY): Payer: Self-pay | Admitting: Family Medicine

## 2012-02-07 DIAGNOSIS — Z1231 Encounter for screening mammogram for malignant neoplasm of breast: Secondary | ICD-10-CM

## 2012-02-11 ENCOUNTER — Ambulatory Visit (HOSPITAL_COMMUNITY)
Admission: RE | Admit: 2012-02-11 | Discharge: 2012-02-11 | Disposition: A | Discharge status: Home / Self Care | Payer: No Typology Code available for payment source | Source: Ambulatory Visit | Attending: Family Medicine | Admitting: Family Medicine

## 2012-02-11 DIAGNOSIS — Z1231 Encounter for screening mammogram for malignant neoplasm of breast: Secondary | ICD-10-CM | POA: Insufficient documentation

## 2012-03-30 ENCOUNTER — Emergency Department (HOSPITAL_COMMUNITY)
Admission: EM | Admit: 2012-03-30 | Discharge: 2012-03-30 | Disposition: A | Discharge status: Home / Self Care | Payer: No Typology Code available for payment source | Attending: Emergency Medicine | Admitting: Emergency Medicine

## 2012-03-30 ENCOUNTER — Encounter (HOSPITAL_COMMUNITY): Payer: Self-pay | Admitting: *Deleted

## 2012-03-30 ENCOUNTER — Emergency Department (HOSPITAL_COMMUNITY): Payer: No Typology Code available for payment source

## 2012-03-30 DIAGNOSIS — F17200 Nicotine dependence, unspecified, uncomplicated: Secondary | ICD-10-CM | POA: Insufficient documentation

## 2012-03-30 DIAGNOSIS — Z8701 Personal history of pneumonia (recurrent): Secondary | ICD-10-CM | POA: Insufficient documentation

## 2012-03-30 DIAGNOSIS — R093 Abnormal sputum: Secondary | ICD-10-CM | POA: Insufficient documentation

## 2012-03-30 DIAGNOSIS — E669 Obesity, unspecified: Secondary | ICD-10-CM | POA: Insufficient documentation

## 2012-03-30 DIAGNOSIS — R0789 Other chest pain: Secondary | ICD-10-CM | POA: Insufficient documentation

## 2012-03-30 DIAGNOSIS — R0989 Other specified symptoms and signs involving the circulatory and respiratory systems: Secondary | ICD-10-CM | POA: Insufficient documentation

## 2012-03-30 DIAGNOSIS — R0600 Dyspnea, unspecified: Secondary | ICD-10-CM

## 2012-03-30 DIAGNOSIS — R0609 Other forms of dyspnea: Secondary | ICD-10-CM | POA: Insufficient documentation

## 2012-03-30 DIAGNOSIS — R059 Cough, unspecified: Secondary | ICD-10-CM | POA: Insufficient documentation

## 2012-03-30 DIAGNOSIS — Z79899 Other long term (current) drug therapy: Secondary | ICD-10-CM | POA: Insufficient documentation

## 2012-03-30 DIAGNOSIS — J4489 Other specified chronic obstructive pulmonary disease: Secondary | ICD-10-CM | POA: Insufficient documentation

## 2012-03-30 DIAGNOSIS — Z8679 Personal history of other diseases of the circulatory system: Secondary | ICD-10-CM | POA: Insufficient documentation

## 2012-03-30 LAB — POCT I-STAT, CHEM 8
BUN: 16 mg/dL (ref 6–23)
Creatinine, Ser: 0.7 mg/dL (ref 0.50–1.10)
Hemoglobin: 15.6 g/dL — ABNORMAL HIGH (ref 12.0–15.0)
Potassium: 3.7 mEq/L (ref 3.5–5.1)
Sodium: 142 mEq/L (ref 135–145)

## 2012-03-30 LAB — CBC
HCT: 43.9 % (ref 36.0–46.0)
MCV: 89.4 fL (ref 78.0–100.0)
RBC: 4.91 MIL/uL (ref 3.87–5.11)
WBC: 7.5 10*3/uL (ref 4.0–10.5)

## 2012-03-30 MED ORDER — ALBUTEROL SULFATE HFA 108 (90 BASE) MCG/ACT IN AERS
2.0000 | INHALATION_SPRAY | RESPIRATORY_TRACT | Status: DC | PRN
  Administered 2012-03-30: 2 via RESPIRATORY_TRACT
  Filled 2012-03-30: qty 6.7

## 2012-03-30 NOTE — ED Notes (Signed)
Pt c/o sob and intermittent L chest pain that radiates to L back since Mon.  Denies cardiac hx but "thinks" she has a hx of copd.  Came today b/c dizzy and lightheaded, like she was about to pass out.

## 2012-03-30 NOTE — ED Notes (Signed)
Pt. Not found in room upon this nurse arrival. Told pt had gone to restroom. Restroom empty. Waited 20-30 minutes for pt to return. Pt. Has not returned at this time.

## 2012-03-30 NOTE — ED Provider Notes (Signed)
History     CSN: [id number]  Arrival date & time 03/30/12  1250   First MD Initiated Contact with Patient 03/30/12 1735      Chief Complaint  Patient presents with  . Shortness of Breath    (Consider location/radiation/quality/duration/timing/severity/associated sxs/prior treatment) Patient is a 52 y.o. female presenting with shortness of breath. The history is provided by the patient.  Shortness of Breath Severity:  Moderate Associated symptoms: chest pain and cough   Associated symptoms: no abdominal pain, no headaches, no rash and no vomiting    patient's had shortness of breath over the last few days. Is a little worse than normal. She's been told that she has COPD and has an albuterol inhaler. She's had no change in her cough. She's also began having some mild left-sided chest pain is worse with breathing. She has recently cut back on her smoking. She smokes about 2 cigarettes a day she was at a pack a day 3 weeks ago. No fevers. She states she's been coughing up some black sputum now. No lightheadedness dizziness, but says that is felt that he began exercising in order to lose some weight.  Past Medical History  Diagnosis Date  . Angina   . Pneumonia ~ 2003  . Shortness of breath on exertion 05/07/11    "all the time"  . COPD (chronic obstructive pulmonary disease)     Past Surgical History  Procedure Laterality Date  . Salpingoophorectomy  1996  . Cesarean section  1995  . Appendectomy  09/1999  . Colon surgery      Family History  Problem Relation Age of Onset  . Diabetes Mother     History  Substance Use Topics  . Smoking status: Current Every Day Smoker -- 0.50 packs/day for 35 years    Types: Cigarettes  . Smokeless tobacco: Never Used  . 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  HENT: Negative for neck stiffness.   Eyes: Negative for pain.  Respiratory: Positive for cough and shortness of breath. Negative for chest tightness.   Cardiovascular: Positive for chest pain. Negative for leg swelling.  Gastrointestinal: Negative for nausea, vomiting, abdominal pain and diarrhea.  Genitourinary: Negative for flank pain.  Musculoskeletal: Negative for back pain.  Skin: Negative for rash.  Neurological: Negative for weakness, numbness and headaches.  Psychiatric/Behavioral: Negative for behavioral problems.    Allergies  Review of patient's allergies indicates no known allergies.  Home Medications   Current Outpatient Rx  Name  Route  Sig  Dispense  Refill  . sertraline (ZOLOFT) 50 MG tablet   Oral   Take 50 mg by mouth daily.         . simvastatin (ZOCOR) 10 MG tablet   Oral   Take 10 mg by mouth at bedtime.           BP 123/46  Pulse 63  Temp(Src) 98.1 F (36.7 C) (Oral)  Resp 21  Ht 5\' 8"  (1.727 m)  Wt 270 lb (122.471 kg)  BMI 41.06 kg/m2  SpO2 96%  Physical Exam  Nursing note and vitals reviewed. Constitutional: She is oriented to person, place, and time. She appears well-developed and well-nourished.  patietn is obese  HENT:  Head: Normocephalic and atraumatic.  Eyes: EOM are normal. Pupils are equal, round, and reactive to light.  Neck: Normal range of motion. Neck supple.  Cardiovascular: Normal rate, regular rhythm and normal heart sounds.   No murmur heard. Pulmonary/Chest: Effort normal and breath sounds normal. No respiratory distress. She has no wheezes. She has no rales.  Abdominal: Soft. Bowel sounds are normal. She exhibits no distension. There is no tenderness. There is no rebound and no guarding.  Musculoskeletal: Normal range of motion. She exhibits no edema.  Neurological: She is alert and oriented to person, place, and time. No cranial nerve deficit.  Skin: Skin is warm and dry.   Psychiatric: She has a normal mood and affect. Her speech is normal.    ED Course  Procedures (including critical care time)  Labs Reviewed  CBC - Abnormal; Notable for the following:    Hemoglobin 15.5 (*)    All other components within normal limits  POCT I-STAT, CHEM 8 - Abnormal; Notable for the following:    Calcium, Ion 1.28 (*)    Hemoglobin 15.6 (*)    All other components within normal limits  D-DIMER, QUANTITATIVE  POCT I-STAT TROPONIN I   Dg Chest 2 View  03/30/2012  *RADIOLOGY REPORT*  Clinical Data: Left chest pain.  Shortness breath.  Cough.  Back pain.  Tobacco use.  CHEST - 2 VIEW  Comparison: 01/31/2012  Findings: Airway thickening may reflect bronchitis or reactive airways disease.  No airspace opacity is identified to suggest bacterial pneumonia pattern.  Cardiac and mediastinal contours appear unremarkable.  Thoracic spondylosis noted.  No pleural effusion.  No acute bony findings observed.  IMPRESSION:  1. Airway thickening may reflect bronchitis or reactive airways disease.  No airspace opacity is identified to suggest bacterial pneumonia pattern.   Original Report Authenticated By: Gaylyn Rong, M.D.      1. Dyspnea      Date: 03/30/2012  Rate: 57  Rhythm: sinus bradycardia  QRS Axis: normal  Intervals: normal  ST/T Wave abnormalities: normal  Conduction Disutrbances:none  Narrative Interpretation:  Bradycardia is new  Old EKG Reviewed: changes noted    MDM  Patient presents with shortness of breath and lower chest pain. EKG is reassuring. D-dimer was done and was negative, however patient eloped before results were given. X-ray shows bronchitis without pneumonia. Doubt cardiac cause of the pain        Juliet Rude. Rubin Payor, MD 03/31/12 [phone]

## 2012-03-30 NOTE — ED Notes (Addendum)
C/o intermittent episodes SOB, can occur while at rest x 2 weeks. Denies cold, fever, chills, CP.  Denies SOB presently. Resp e/u, no distress. States cough x 2-3 days.

## 2012-05-31 ENCOUNTER — Emergency Department (HOSPITAL_COMMUNITY): Payer: BC Managed Care – PPO

## 2012-05-31 ENCOUNTER — Emergency Department (HOSPITAL_COMMUNITY)
Admission: EM | Admit: 2012-05-31 | Discharge: 2012-05-31 | Disposition: A | Discharge status: Home / Self Care | Payer: BC Managed Care – PPO | Attending: Emergency Medicine | Admitting: Emergency Medicine

## 2012-05-31 ENCOUNTER — Encounter (HOSPITAL_COMMUNITY): Payer: Self-pay | Admitting: Emergency Medicine

## 2012-05-31 DIAGNOSIS — J029 Acute pharyngitis, unspecified: Secondary | ICD-10-CM | POA: Insufficient documentation

## 2012-05-31 DIAGNOSIS — Z8679 Personal history of other diseases of the circulatory system: Secondary | ICD-10-CM | POA: Insufficient documentation

## 2012-05-31 DIAGNOSIS — R109 Unspecified abdominal pain: Secondary | ICD-10-CM | POA: Insufficient documentation

## 2012-05-31 DIAGNOSIS — J069 Acute upper respiratory infection, unspecified: Secondary | ICD-10-CM | POA: Insufficient documentation

## 2012-05-31 DIAGNOSIS — J3489 Other specified disorders of nose and nasal sinuses: Secondary | ICD-10-CM | POA: Insufficient documentation

## 2012-05-31 DIAGNOSIS — H9209 Otalgia, unspecified ear: Secondary | ICD-10-CM | POA: Insufficient documentation

## 2012-05-31 DIAGNOSIS — J4489 Other specified chronic obstructive pulmonary disease: Secondary | ICD-10-CM | POA: Insufficient documentation

## 2012-05-31 DIAGNOSIS — Z8701 Personal history of pneumonia (recurrent): Secondary | ICD-10-CM | POA: Insufficient documentation

## 2012-05-31 DIAGNOSIS — F17200 Nicotine dependence, unspecified, uncomplicated: Secondary | ICD-10-CM | POA: Insufficient documentation

## 2012-05-31 DIAGNOSIS — J449 Chronic obstructive pulmonary disease, unspecified: Secondary | ICD-10-CM | POA: Insufficient documentation

## 2012-05-31 DIAGNOSIS — Z79899 Other long term (current) drug therapy: Secondary | ICD-10-CM | POA: Insufficient documentation

## 2012-05-31 MED ORDER — GUAIFENESIN 100 MG/5ML PO LIQD: 200.0000 mg | Freq: Three times a day (TID) | ORAL | Status: DC | PRN

## 2012-05-31 NOTE — Progress Notes (Signed)
   CARE MANAGEMENT ED NOTE 05/31/2012  Patient:  Connie Rogers, Connie Rogers   Account Number:  [id number]  Date Initiated:  05/31/2012  Documentation initiated by:  [email or web address],NFAO  Subjective/Objective Assessment:   CM-  consult.     Subjective/Objective Assessment Detail:     Action/Plan:   Action/Plan Detail:   Anticipated DC Date:  05/31/2012     Status Recommendation to Physician:   Result of Recommendation:    Other ED Services  Consult Working Plan    DC Planning Services  PCP issues  Other  Outpatient Services - Pt will follow up    Choice offered to / List presented to:            Status of service:  Completed, signed off  ED Comments:   ED Comments Detail:  ED-CM- went to  see patient to  check if PCP in place.Patient  reports her orange card expired and  that she has not recently seen  her PCP-provider.Patient requested charity care-CM explained as  patient  has  blue cross/ blue shield She would  not  qualify for  this request-Patient verbalized her  understanding.Patient  advised to print her insurance Details while she  awaits her  insurance card-and  also educated how  to  access a PCP using  the  toll free-customer Services helpline.Patient advised of resources for  her prescriptions at  [facility].

## 2012-05-31 NOTE — ED Notes (Signed)
Pt c/o dry cough and rt earache since yesterday.

## 2012-05-31 NOTE — ED Provider Notes (Signed)
History     CSN: [id number]  Arrival date & time 05/31/12  6213   First MD Initiated Contact with Patient 05/31/12 [phone]      Chief Complaint  Patient presents with  . Cough  . Otalgia    (Consider location/radiation/quality/duration/timing/severity/associated sxs/prior treatment) HPI Comments: 52 y.o. Female with PMHx of pneumonia, COPD, and shortness of breath presents today complaining of dry cough, sore throat, right ear pain, rhinorrhea, and mild abdominal pain since yesterday. Pt denies fever, swollen glands, nausea, vomiting, chest pain, headache, increased dyspnea. Pt states she is still a daily smoker although she has cut down.   Pt states her orange card expired and she is in the process of getting a primary care provider.   Patient is a 52 y.o. female presenting with cough and ear pain.  Cough Associated symptoms: ear pain, rhinorrhea and sore throat   Associated symptoms: no chest pain, no diaphoresis, no fever, no headaches, no rash and no shortness of breath   Otalgia Associated symptoms: cough, rhinorrhea and sore throat   Associated symptoms: no diarrhea, no fever, no headaches, no neck pain, no rash and no vomiting     Past Medical History  Diagnosis Date  . Angina   . Pneumonia ~ 2003  . Shortness of breath on exertion 05/07/11    "all the time"  . COPD (chronic obstructive pulmonary disease)     Past Surgical History  Procedure Laterality Date  . Salpingoophorectomy  1996  . Cesarean section  1995  . Appendectomy  09/1999  . Colon surgery      Family History  Problem Relation Age of Onset  . Diabetes Mother     History  Substance Use Topics  . Smoking status: Current Every Day Smoker -- 0.50 packs/day for 35 years    Types: Cigarettes  . Smokeless tobacco: Never Used  . 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  HENT: Positive for ear pain, sore throat and rhinorrhea. Negative for neck pain and neck stiffness.        Right  Eyes: Negative for visual disturbance.  Respiratory: Positive for cough. Negative for apnea, chest tightness and shortness of breath.   Cardiovascular: Negative for chest pain and palpitations.  Gastrointestinal: Negative for nausea, vomiting, diarrhea and constipation.  Genitourinary: Negative for dysuria.  Musculoskeletal: Negative for gait problem.  Skin: Negative for rash.  Neurological: Negative for dizziness, weakness, light-headedness, numbness and headaches.    Allergies  Review of patient's allergies indicates no known allergies.  Home Medications   Current Outpatient Rx  Name  Route  Sig  Dispense  Refill  . sertraline (ZOLOFT) 50 MG tablet   Oral   Take 50 mg by mouth daily.         . simvastatin (ZOCOR) 10 MG tablet   Oral   Take 10 mg by mouth at bedtime.           BP 133/78  Pulse 76  Temp(Src) 99.1 F (37.3 C) (Oral)  Resp 19  Ht 5\' 9"  (1.753 m)  Wt 260 lb (117.935 kg)  BMI 38.38 kg/m2  SpO2 98%  Physical Exam  Nursing note and vitals reviewed. Constitutional: She  is oriented to person, place, and time. She appears well-developed and well-nourished. No distress.  HENT:  Head: Normocephalic and atraumatic.  Eyes: Conjunctivae and EOM are normal.  Neck: Normal range of motion. Neck supple.  No meningeal signs  Cardiovascular: Normal rate, regular rhythm and normal heart sounds.  Exam reveals no gallop and no friction rub.   No murmur heard. Pulmonary/Chest: Effort normal and breath sounds normal. No respiratory distress.  Diffuse rhonchi bilaterally  Abdominal: Soft. Bowel sounds are normal. She exhibits no distension. There is no tenderness. There is no rebound and no guarding.  Musculoskeletal: Normal range of motion. She exhibits no edema and no tenderness.   Neurological: She is alert and oriented to person, place, and time. No cranial nerve deficit.  Skin: Skin is warm and dry. She is not diaphoretic. No erythema.  Psychiatric: She has a normal mood and affect.    ED Course  Procedures (including critical care time)  Labs Reviewed - No data to display Dg Chest 2 View  05/31/2012   *RADIOLOGY REPORT*  Clinical Data: Cough, fever, body aches, smoking history  CHEST - 2 VIEW  Comparison: Chest x-ray of 03/30/2012  Findings: No active infiltrate or effusion is seen.  As noted previously there is some peribronchial thickening most consistent with chronic bronchitis.  Mediastinal contours appear stable.  The heart is within upper limits of normal.  No acute bony abnormality is seen.  There are degenerative changes throughout the thoracic spine.  IMPRESSION: No pneumonia.  Probable chronic bronchitis.   Original Report Authenticated By: Dwyane Dee, M.D.     1. Upper respiratory infection       MDM  Pt CXR negative for acute infiltrate. Patients symptoms are consistent with URI, likely viral etiology. Discussed that antibiotics are not indicated for viral infections. Pt will be discharged with symptomatic treatment.  Verbalizes understanding and is agreeable with plan. Pt is hemodynamically stable & in NAD prior to dc. At this time there does not appear to be any evidence of an acute emergency medical condition and the patient appears stable for discharge with appropriate outpatient follow up.Diagnosis was discussed with patient who verbalizes understanding and is agreeable to discharge. Pt case discussed with Dr. Bernette Mayers who agrees with plan.     Glade Nurse, PA-C 05/31/12 1651

## 2012-06-01 NOTE — ED Provider Notes (Signed)
Medical screening examination/treatment/procedure(s) were performed by non-physician practitioner and as supervising physician I was immediately available for consultation/collaboration.   Madina Galati B. Anthony Tamburo, MD 06/01/12 0701 

## 2012-06-02 ENCOUNTER — Emergency Department (HOSPITAL_COMMUNITY)
Admission: EM | Admit: 2012-06-02 | Discharge: 2012-06-02 | Disposition: A | Discharge status: Home / Self Care | Payer: BC Managed Care – PPO | Attending: Emergency Medicine | Admitting: Emergency Medicine

## 2012-06-02 ENCOUNTER — Encounter (HOSPITAL_COMMUNITY): Payer: Self-pay

## 2012-06-02 DIAGNOSIS — R05 Cough: Secondary | ICD-10-CM | POA: Insufficient documentation

## 2012-06-02 DIAGNOSIS — Z8701 Personal history of pneumonia (recurrent): Secondary | ICD-10-CM | POA: Insufficient documentation

## 2012-06-02 DIAGNOSIS — R059 Cough, unspecified: Secondary | ICD-10-CM | POA: Insufficient documentation

## 2012-06-02 DIAGNOSIS — Z720 Tobacco use: Secondary | ICD-10-CM

## 2012-06-02 DIAGNOSIS — R6510 Systemic inflammatory response syndrome (SIRS) of non-infectious origin without acute organ dysfunction: Secondary | ICD-10-CM

## 2012-06-02 DIAGNOSIS — Z8679 Personal history of other diseases of the circulatory system: Secondary | ICD-10-CM | POA: Insufficient documentation

## 2012-06-02 DIAGNOSIS — IMO0001 Reserved for inherently not codable concepts without codable children: Secondary | ICD-10-CM

## 2012-06-02 DIAGNOSIS — J209 Acute bronchitis, unspecified: Secondary | ICD-10-CM | POA: Insufficient documentation

## 2012-06-02 DIAGNOSIS — F17200 Nicotine dependence, unspecified, uncomplicated: Secondary | ICD-10-CM | POA: Insufficient documentation

## 2012-06-02 DIAGNOSIS — Z79899 Other long term (current) drug therapy: Secondary | ICD-10-CM | POA: Insufficient documentation

## 2012-06-02 DIAGNOSIS — R079 Chest pain, unspecified: Secondary | ICD-10-CM | POA: Insufficient documentation

## 2012-06-02 DIAGNOSIS — J440 Chronic obstructive pulmonary disease with acute lower respiratory infection: Secondary | ICD-10-CM | POA: Insufficient documentation

## 2012-06-02 LAB — BASIC METABOLIC PANEL
CO2: 22 mEq/L (ref 19–32)
GFR calc non Af Amer: >90 mL/min (ref >90)
Glucose, Bld: 116 mg/dL — ABNORMAL HIGH (ref 70–99)
Potassium: 4.1 mEq/L (ref 3.5–5.1)
Sodium: 136 mEq/L (ref 135–145)

## 2012-06-02 LAB — CBC WITH DIFFERENTIAL/PLATELET
Basophils Relative: 0 % (ref 0–1)
Eosinophils Absolute: 0 10*3/uL (ref 0.0–0.7)
Eosinophils Relative: 1 % (ref 0–5)
Lymphs Abs: 1.4 10*3/uL (ref 0.7–4.0)
MCH: 31.5 pg (ref 26.0–34.0)
MCHC: 35 g/dL (ref 30.0–36.0)
MCV: 90 fL (ref 78.0–100.0)
Monocytes Relative: 5 % (ref 3–12)
Neutrophils Relative %: 66 % (ref 43–77)
Platelets: 170 10*3/uL (ref 150–400)
RBC: 4.99 MIL/uL (ref 3.87–5.11)

## 2012-06-02 LAB — POCT I-STAT TROPONIN I: Troponin i, poc: 0 ng/mL (ref 0.00–0.08)

## 2012-06-02 MED ORDER — ALBUTEROL SULFATE (5 MG/ML) 0.5% IN NEBU
10.0000 mg | INHALATION_SOLUTION | Freq: Once | RESPIRATORY_TRACT | Status: AC
  Administered 2012-06-02: 10 mg via RESPIRATORY_TRACT
  Filled 2012-06-02: qty 2

## 2012-06-02 MED ORDER — PREDNISONE 20 MG PO TABS
60.0000 mg | ORAL_TABLET | Freq: Once | ORAL | Status: AC
  Administered 2012-06-02: 60 mg via ORAL
  Filled 2012-06-02: qty 3

## 2012-06-02 MED ORDER — HYDROCOD POLST-CHLORPHEN POLST 10-8 MG/5ML PO LQCR: 5.0000 mL | Freq: Two times a day (BID) | ORAL | Status: DC | PRN

## 2012-06-02 MED ORDER — PREDNISONE 20 MG PO TABS: ORAL_TABLET | ORAL | Status: DC

## 2012-06-02 NOTE — ED Notes (Signed)
[facility]- pt from home with c/o cp, diff. Breathing, cough.  sts started Tuesday- went to [place] was dcd home. sts just still feeling worse with increasing cough and dif breathing.

## 2012-06-02 NOTE — ED Provider Notes (Signed)
History     CSN: [id number]  Arrival date & time 06/02/12  4098   First MD Initiated Contact with Patient 06/02/12 [phone]      No chief complaint on file.   (Consider location/radiation/quality/duration/timing/severity/associated sxs/prior treatment) HPI  Connie Rogers is a 52 yo 1ppd smoker with non-oxygen dependent COPD. She presents with complaints of productive cough x 3 days. She has wheezing, subjective fever and SOB. She was TAR from the ED of Connie Rogers approx 36 hrs ago with dx of bronchitis and plan for symptomatic management. She had a CXR which was unremarkable during that work up.   She called EMS from her home tonight because her cough has not improved and she felt SOB and appreciated a tight feeling in her chest.   Past Medical History  Diagnosis Date  . Angina   . Pneumonia ~ 2003  . Shortness of breath on exertion 05/07/11    "all the time"  . COPD (chronic obstructive pulmonary disease)     Past Surgical History  Procedure Laterality Date  . Salpingoophorectomy  1996  . Cesarean section  1995  . Appendectomy  09/1999  . Colon surgery      Family History  Problem Relation Age of Onset  . Diabetes Mother     History  Substance Use Topics  . Smoking status: Current Every Day Smoker -- 0.50 packs/day for 35 years    Types: Cigarettes  . Smokeless tobacco: Never Used  . 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"    OB History   Grav Para Term Preterm Abortions TAB SAB Ect Mult Living                  Review of Systems Gen: As per history of present illness, otherwise negative Eyes: no discharge or drainage, no occular pain or visual changes Nose: no epistaxis or rhinorrhea Mouth: no dental pain, no sore throat Neck: no neck pain Lungs: As per history of present illness, otherwise negative CV: no chest pain, palpitations, dependent edema or orthopnea Abd: no abdominal pain, nausea, vomiting GU: no dysuria or gross  hematuria MSK: no myalgias or arthralgias Neuro: no headache, no focal neurologic deficits Skin: no rash Psyche: negative.  Allergies  Review of patient's allergies indicates no known allergies.  Home Medications   Current Outpatient Rx  Name  Route  Sig  Dispense  Refill  . guaiFENesin (ROBITUSSIN) 100 MG/5ML liquid   Oral   Take 10 mLs (200 mg total) by mouth 3 (three) times daily as needed for cough.   120 mL   0   . sertraline (ZOLOFT) 50 MG tablet   Oral   Take 100 mg by mouth daily.          . simvastatin (ZOCOR) 10 MG tablet   Oral   Take 10 mg by mouth at bedtime.           There were no vitals taken for this visit.  Physical Exam Gen: well developed and well nourished appearing, anxious Head: NCAT Eyes: PERL, EOMI Nose: no epistaixis or rhinorrhea Mouth/throat: mucosa is moist and pink Neck: supple, no stridor Lungs: CTA B, RR 24/min, scattered wheezing CV: RRR, no murmur, extremities appear well perfused.  Abd: soft, notender, nondistended Back: no ttp, no cva ttp Skin: no rashese, wnl Neuro: CN ii-xii grossly intact, no focal deficits Psyche; normal affect,  calm and cooperative.   ED Course  Procedures (including  critical care time)  Labs Reviewed - No data to display Dg Chest 2 View  05/31/2012   *RADIOLOGY REPORT*  Clinical Data: Cough, fever, body aches, smoking history  CHEST - 2 VIEW  Comparison: Chest x-ray of 03/30/2012  Findings: No active infiltrate or effusion is seen.  As noted previously there is some peribronchial thickening most consistent with chronic bronchitis.  Mediastinal contours appear stable.  The heart is within upper limits of normal.  No acute bony abnormality is seen.  There are degenerative changes throughout the thoracic spine.  IMPRESSION: No pneumonia.  Probable chronic bronchitis.   Original Report Authenticated By: Dwyane Dee, M.D.     No diagnosis found.    MDM  Patient re-evaluated by me after tx with nebs  and Prednisone. Wheezing has resolved. Patient is tachycardic and I believe this is response to Albuterol.  Antibiotic not indicated. Patient will benefit from more aggressive symptomatic management. I have also counseled her regarding the importance of scheduled Albuterol MDI tx and the absolute neccessity of smoking cessation. She acknowledges and expresses understanding. She is stable for d/c with plan for Prednisone burst, Tussionex, smoking cessation and scheduled Albuterol MDI with outpatient follow up.        Brandt Loosen, MD 06/02/12 [phone]

## 2012-06-02 NOTE — ED Notes (Signed)
Pt dc to home. Pt sts understanding to dc instructions. Pt ambulatory to exit without difficulty. 

## 2012-10-31 ENCOUNTER — Encounter (HOSPITAL_COMMUNITY): Payer: Self-pay | Admitting: Emergency Medicine

## 2012-10-31 ENCOUNTER — Emergency Department (HOSPITAL_COMMUNITY)
Admission: EM | Admit: 2012-10-31 | Discharge: 2012-10-31 | Disposition: A | Discharge status: Home / Self Care | Payer: BC Managed Care – PPO | Attending: Emergency Medicine | Admitting: Emergency Medicine

## 2012-10-31 DIAGNOSIS — Z8701 Personal history of pneumonia (recurrent): Secondary | ICD-10-CM | POA: Insufficient documentation

## 2012-10-31 DIAGNOSIS — IMO0002 Reserved for concepts with insufficient information to code with codable children: Secondary | ICD-10-CM | POA: Insufficient documentation

## 2012-10-31 DIAGNOSIS — M549 Dorsalgia, unspecified: Secondary | ICD-10-CM

## 2012-10-31 DIAGNOSIS — Z8679 Personal history of other diseases of the circulatory system: Secondary | ICD-10-CM | POA: Insufficient documentation

## 2012-10-31 DIAGNOSIS — Z79899 Other long term (current) drug therapy: Secondary | ICD-10-CM | POA: Insufficient documentation

## 2012-10-31 DIAGNOSIS — S46909A Unspecified injury of unspecified muscle, fascia and tendon at shoulder and upper arm level, unspecified arm, initial encounter: Secondary | ICD-10-CM | POA: Insufficient documentation

## 2012-10-31 DIAGNOSIS — F17200 Nicotine dependence, unspecified, uncomplicated: Secondary | ICD-10-CM | POA: Insufficient documentation

## 2012-10-31 DIAGNOSIS — Y92410 Unspecified street and highway as the place of occurrence of the external cause: Secondary | ICD-10-CM | POA: Insufficient documentation

## 2012-10-31 DIAGNOSIS — S4980XA Other specified injuries of shoulder and upper arm, unspecified arm, initial encounter: Secondary | ICD-10-CM | POA: Insufficient documentation

## 2012-10-31 DIAGNOSIS — Y9389 Activity, other specified: Secondary | ICD-10-CM | POA: Insufficient documentation

## 2012-10-31 DIAGNOSIS — J4489 Other specified chronic obstructive pulmonary disease: Secondary | ICD-10-CM | POA: Insufficient documentation

## 2012-10-31 DIAGNOSIS — J449 Chronic obstructive pulmonary disease, unspecified: Secondary | ICD-10-CM | POA: Insufficient documentation

## 2012-10-31 MED ORDER — NAPROXEN 500 MG PO TABS: 500.0000 mg | ORAL_TABLET | Freq: Two times a day (BID) | ORAL | Status: DC

## 2012-10-31 MED ORDER — CYCLOBENZAPRINE HCL 10 MG PO TABS: 10.0000 mg | ORAL_TABLET | Freq: Two times a day (BID) | ORAL | Status: DC | PRN

## 2012-10-31 MED ORDER — NAPROXEN 250 MG PO TABS
500.0000 mg | ORAL_TABLET | Freq: Once | ORAL | Status: AC
  Administered 2012-10-31: 500 mg via ORAL
  Filled 2012-10-31: qty 2

## 2012-10-31 NOTE — ED Provider Notes (Signed)
Medical screening examination/treatment/procedure(s) were performed by non-physician practitioner and as supervising physician I was immediately available for consultation/collaboration.   Reveca Desmarais B. Duaa Stelzner, MD 10/31/12 1549 
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
## 2013-03-20 ENCOUNTER — Encounter (HOSPITAL_COMMUNITY): Payer: Self-pay | Admitting: Emergency Medicine

## 2013-03-20 ENCOUNTER — Emergency Department (HOSPITAL_COMMUNITY)
Admission: EM | Admit: 2013-03-20 | Discharge: 2013-03-20 | Disposition: A | Discharge status: Home / Self Care | Payer: BC Managed Care – PPO | Attending: Emergency Medicine | Admitting: Emergency Medicine

## 2013-03-20 ENCOUNTER — Emergency Department (HOSPITAL_COMMUNITY): Payer: BC Managed Care – PPO

## 2013-03-20 DIAGNOSIS — I209 Angina pectoris, unspecified: Secondary | ICD-10-CM | POA: Insufficient documentation

## 2013-03-20 DIAGNOSIS — R0981 Nasal congestion: Secondary | ICD-10-CM

## 2013-03-20 DIAGNOSIS — H9209 Otalgia, unspecified ear: Secondary | ICD-10-CM | POA: Insufficient documentation

## 2013-03-20 DIAGNOSIS — J4489 Other specified chronic obstructive pulmonary disease: Secondary | ICD-10-CM | POA: Insufficient documentation

## 2013-03-20 DIAGNOSIS — Z8701 Personal history of pneumonia (recurrent): Secondary | ICD-10-CM | POA: Insufficient documentation

## 2013-03-20 DIAGNOSIS — G479 Sleep disorder, unspecified: Secondary | ICD-10-CM | POA: Insufficient documentation

## 2013-03-20 DIAGNOSIS — J159 Unspecified bacterial pneumonia: Secondary | ICD-10-CM | POA: Insufficient documentation

## 2013-03-20 DIAGNOSIS — J189 Pneumonia, unspecified organism: Secondary | ICD-10-CM

## 2013-03-20 DIAGNOSIS — J449 Chronic obstructive pulmonary disease, unspecified: Secondary | ICD-10-CM | POA: Insufficient documentation

## 2013-03-20 DIAGNOSIS — Z79899 Other long term (current) drug therapy: Secondary | ICD-10-CM | POA: Insufficient documentation

## 2013-03-20 DIAGNOSIS — F17200 Nicotine dependence, unspecified, uncomplicated: Secondary | ICD-10-CM | POA: Insufficient documentation

## 2013-03-20 MED ORDER — OXYMETAZOLINE HCL 0.05 % NA SOLN: 1.0000 | Freq: Two times a day (BID) | NASAL | Status: DC

## 2013-03-20 MED ORDER — AZITHROMYCIN 250 MG PO TABS: ORAL_TABLET | ORAL | Status: DC

## 2013-03-20 NOTE — Discharge Instructions (Signed)
Pneumonia, Adult °Pneumonia is an infection of the lungs.  °CAUSES °Pneumonia may be caused by bacteria or a virus. Usually, these infections are caused by breathing infectious particles into the lungs (respiratory tract). °SYMPTOMS  °· Cough. °· Fever. °· Chest pain. °· Increased rate of breathing. °· Wheezing. °· Mucus production. °DIAGNOSIS  °If you have the common symptoms of pneumonia, your caregiver will typically confirm the diagnosis with a chest X-ray. The X-ray will show an abnormality in the lung (pulmonary infiltrate) if you have pneumonia. Other tests of your blood, urine, or sputum may be done to find the specific cause of your pneumonia. Your caregiver may also do tests (blood gases or pulse oximetry) to see how well your lungs are working. °TREATMENT  °Some forms of pneumonia may be spread to other people when you cough or sneeze. You may be asked to wear a mask before and during your exam. Pneumonia that is caused by bacteria is treated with antibiotic medicine. Pneumonia that is caused by the influenza virus may be treated with an antiviral medicine. Most other viral infections must run their course. These infections will not respond to antibiotics.  °PREVENTION °A pneumococcal shot (vaccine) is available to prevent a common bacterial cause of pneumonia. This is usually suggested for: °· People over 65 years old. °· Patients on chemotherapy. °· People with chronic lung problems, such as bronchitis or emphysema. °· People with immune system problems. °If you are over 65 or have a high risk condition, you may receive the pneumococcal vaccine if you have not received it before. In some countries, a routine influenza vaccine is also recommended. This vaccine can help prevent some cases of pneumonia. You may be offered the influenza vaccine as part of your care. °If you smoke, it is time to quit. You may receive instructions on how to stop smoking. Your caregiver can provide medicines and counseling to  help you quit. °HOME CARE INSTRUCTIONS  °· Cough suppressants may be used if you are losing too much rest. However, coughing protects you by clearing your lungs. You should avoid using cough suppressants if you can. °· Your caregiver may have prescribed medicine if he or she thinks your pneumonia is caused by a bacteria or influenza. Finish your medicine even if you start to feel better. °· Your caregiver may also prescribe an expectorant. This loosens the mucus to be coughed up. °· Only take over-the-counter or prescription medicines for pain, discomfort, or fever as directed by your caregiver. °· Do not smoke. Smoking is a common cause of bronchitis and can contribute to pneumonia. If you are a smoker and continue to smoke, your cough may last several weeks after your pneumonia has cleared. °· A cold steam vaporizer or humidifier in your room or home may help loosen mucus. °· Coughing is often worse at night. Sleeping in a semi-upright position in a recliner or using a couple pillows under your head will help with this. °· Get rest as you feel it is needed. Your body will usually let you know when you need to rest. °SEEK IMMEDIATE MEDICAL CARE IF:  °· Your illness becomes worse. This is especially true if you are elderly or weakened from any other disease. °· You cannot control your cough with suppressants and are losing sleep. °· You begin coughing up blood. °· You develop pain which is getting worse or is uncontrolled with medicines. °· You have a fever. °· Any of the symptoms which initially brought you in for treatment   are getting worse rather than better.  You develop shortness of breath or chest pain. MAKE SURE YOU:   Understand these instructions.  Will watch your condition.  Will get help right away if you are not doing well or get worse. Document Released: 12/28/2004 Document Revised: 03/22/2011 Document Reviewed: 03/19/2010 [facility]Exit[facility] Patient Information 2014 [place], [place]. Oxymetazoline  nasal spray What is this medicine? Oxymetazoline (OX ee me TAZ oh leen) is a nasal decongestant. This medicine is used to treat nasal congestion or a stuffy nose. This medicine will not treat an infection. This medicine may be used for other purposes; ask your health care provider or pharmacist if you have questions. COMMON BRAND NAME(S): 12 Hour Nasal , Afrin Extra Moisturizing, Afrin Nasal Sinus, Afrin, Dristan, Duration, Genasal , Mucinex Full Force, Mucinex Moisture Smart, Mucinex Sinus-Max, Nasal Relief , Neo-Synephrine 12-Hour, Neo-Synephrine Severe Sinus Congestion, Sinex 12-Hour, Sudafed OM Sinus Cold Moisturizing, Sudafed OM Sinus Congestion Moisturizing, Vicks Sinex, Zicam Extreme Congestion Relief, Zicam Intense Sinus What should I tell my health care provider before I take this medicine? They need to know if you have any of these conditions: -diabetes -heart disease -high blood pressure -thyroid disease -trouble urinating due to an enlarged prostate gland -an unusual or allergic reaction to oxymetazoline, other medicines, foods, dyes, or preservatives -pregnant or trying to get pregnant -breast-feeding How should I use this medicine? This medicine is for use in the nose. Do not take by mouth. Follow the directions on the package label. Shake well before using. Use your medicine at regular intervals or as directed by your health care provider. Do not use it more often than directed. Do not use for more than 3 days in a row without advice. Make sure that you are using your nasal spray correctly. Ask your doctor or health care provider if you have any questions. Talk to your pediatrician regarding the use of this medicine in children. While this drug may be prescribed for children for selected conditions, precautions do apply. Overdosage: If you think you've taken too much of this medicine contact a poison control center or emergency room at once. Overdosage: If you think you have taken  too much of this medicine contact a poison control center or emergency room at once. NOTE: This medicine is only for you. Do not share this medicine with others. What if I miss a dose? If you miss a dose, use it as soon as you can. If it is almost time for your next dose, use only that dose. Do not use double or extra doses. What may interact with this medicine? Do not take this medicine with any of the following medications: -MAOIs like Marplan, Nardil, and Parnate This list may not describe all possible interactions. Give your health care provider a list of all the medicines, herbs, non-prescription drugs, or dietary supplements you use. Also tell them if you smoke, drink alcohol, or use illegal drugs. Some items may interact with your medicine. What should I watch for while using this medicine? Tell your doctor or healthcare professional if your symptoms do not start to get better or if they get worse. Do not share this bottle with anyone else as this may spread germs. What side effects may I notice from receiving this medicine? Side effects that you should report to your doctor or health care professional as soon as possible: -allergic reactions like skin rash, itching or hives, swelling of the face, lips, or tongue  Side effects that usually do not  require medical attention (Report these to your doctor or health care professional if they continue or are bothersome.): -burning, stinging, or irritation in the nose right after use -increased nasal discharge -sneezing This list may not describe all possible side effects. Call your doctor for medical advice about side effects. You may report side effects to FDA at 1-800-FDA-1088. Where should I keep my medicine? Keep out of the reach of children. Store at room temperature between 20 and 25 degrees C (68 and 77 degrees F). Throw away any unused medicine after the expiration date. NOTE: This sheet is a summary. It may not cover all possible  information. If you have questions about this medicine, talk to your doctor, pharmacist, or health care provider.  2014, Elsevier/Gold Standard. (2010-07-29 14:11:31)  Azithromycin tablets What is this medicine? AZITHROMYCIN (az ith roe MYE sin) is a macrolide antibiotic. It is used to treat or prevent certain kinds of bacterial infections. It will not work for colds, flu, or other viral infections. This medicine may be used for other purposes; ask your health care provider or pharmacist if you have questions. COMMON BRAND NAME(S): Zithromax Tri-Pak, Zithromax Z-Pak, Zithromax What should I tell my health care provider before I take this medicine? They need to know if you have any of these conditions: -kidney disease -liver disease -irregular heartbeat or heart disease -an unusual or allergic reaction to azithromycin, erythromycin, other macrolide antibiotics, foods, dyes, or preservatives -pregnant or trying to get pregnant -breast-feeding How should I use this medicine? Take this medicine by mouth with a full glass of water. Follow the directions on the prescription label. The tablets can be taken with food or on an empty stomach. If the medicine upsets your stomach, take it with food. Take your medicine at regular intervals. Do not take your medicine more often than directed. Take all of your medicine as directed even if you think your are better. Do not skip doses or stop your medicine early. Talk to your pediatrician regarding the use of this medicine in children. Special care may be needed. Overdosage: If you think you have taken too much of this medicine contact a poison control center or emergency room at once. NOTE: This medicine is only for you. Do not share this medicine with others. What if I miss a dose? If you miss a dose, take it as soon as you can. If it is almost time for your next dose, take only that dose. Do not take double or extra doses. What may interact with this  medicine? Do not take this medicine with any of the following medications: -lincomycin This medicine may also interact with the following medications: -amiodarone -antacids -cyclosporine -digoxin -magnesium -nelfinavir -phenytoin -warfarin This list may not describe all possible interactions. Give your health care provider a list of all the medicines, herbs, non-prescription drugs, or dietary supplements you use. Also tell them if you smoke, drink alcohol, or use illegal drugs. Some items may interact with your medicine. What should I watch for while using this medicine? Tell your doctor or health care professional if your symptoms do not improve. Do not treat diarrhea with over the counter products. Contact your doctor if you have diarrhea that lasts more than 2 days or if it is severe and watery. This medicine can make you more sensitive to the sun. Keep out of the sun. If you cannot avoid being in the sun, wear protective clothing and use sunscreen. Do not use sun lamps or tanning beds/booths. What  side effects may I notice from receiving this medicine? Side effects that you should report to your doctor or health care professional as soon as possible: -allergic reactions like skin rash, itching or hives, swelling of the face, lips, or tongue -confusion, nightmares or hallucinations -dark urine -difficulty breathing -hearing loss -irregular heartbeat or chest pain -pain or difficulty passing urine -redness, blistering, peeling or loosening of the skin, including inside the mouth -white patches or sores in the mouth -yellowing of the eyes or skin Side effects that usually do not require medical attention (report to your doctor or health care professional if they continue or are bothersome): -diarrhea -dizziness, drowsiness -headache -stomach upset or vomiting -tooth discoloration -vaginal irritation This list may not describe all possible side effects. Call your doctor for  medical advice about side effects. You may report side effects to FDA at 1-800-FDA-1088. Where should I keep my medicine? Keep out of the reach of children. Store at room temperature between 15 and 30 degrees C (59 and 86 degrees F). Throw away any unused medicine after the expiration date. NOTE: This sheet is a summary. It may not cover all possible information. If you have questions about this medicine, talk to your doctor, pharmacist, or health care provider.  2014, [employer]vier/Gold Standard. (2012-06-21 15:42:07)

## 2013-03-20 NOTE — ED Notes (Signed)
Pt complaining of nasal congestion and headache from congestion. Lungs were clear upon auscultation. Pt works in a assisted living facility. Pt states she has been around a lot of people with similar symptoms.

## 2013-03-20 NOTE — ED Provider Notes (Signed)
CSN: [id number]     Arrival date & time 03/20/13  1033 History  This chart was scribed for non-physician practitioner, Arthor CaptainAbigail Charlize Hathaway, PA-C working with Flint MelterElliott L Wentz, MD by Greggory StallionKayla Andersen, ED scribe. This patient was seen in room TR07C/TR07C and the patient's care was started at 11:51 AM.    Chief Complaint  Patient presents with  . URI   The history is provided by the patient. No language interpreter was used.   HPI Comments: Connie Rogers is a 53 y.o. female with history of COPD who presents to the Emergency Department complaining of nasal congestion, productive cough and chest congestion that started 4 days ago. She has had associated ear pain, difficulty sleeping and subjective fever. Pt has taken mucinex, Theraflu, ibuprofen, sudafed and alka seltzer with no relief. Pt smokes cigarettes daily.   Past Medical History  Diagnosis Date  . Angina   . Pneumonia ~ 2003  . Shortness of breath on exertion 05/07/11    "all the time"  . COPD (chronic obstructive pulmonary disease)    Past Surgical History  Procedure Laterality Date  . Salpingoophorectomy  1996  . Cesarean section  1995  . Appendectomy  09/1999  . Colon surgery     Family History  Problem Relation Age of Onset  . Diabetes Mother    History  Substance Use Topics  . Smoking status: Current Every Day Smoker -- 1.00 packs/day for 35 years    Types: Cigarettes  . Smokeless tobacco: Never Used  . 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  HENT: Positive for congestion and ear pain.   Eyes: Negative for redness.  Respiratory: Positive for cough.   Cardiovascular: Negative for chest pain.  Gastrointestinal: Negative for abdominal distention.  Musculoskeletal: Negative for gait problem.  Skin: Negative for rash.  Neurological: Negative for  speech difficulty.  Psychiatric/Behavioral: Negative for confusion.   Allergies  Review of patient's allergies indicates no known allergies.  Home Medications   Current Outpatient Rx  Name  Route  Sig  Dispense  Refill  . buPROPion (WELLBUTRIN XL) 150 MG 24 hr tablet   Oral   Take 150 mg by mouth daily.         Marland Kitchen. doxycycline (VIBRAMYCIN) 100 MG capsule   Oral   Take 100 mg by mouth 2 (two) times daily. For 10 days, starting 03/12/13         . GuaiFENesin (MUCINEX PO)   Oral   Take 30 mLs by mouth daily as needed (for cough/flu symptoms).         . Phenyleph-CPM-DM-Aspirin (ALKA-SELTZER PLUS COLD & COUGH PO)   Oral   Take 1 packet by mouth daily as needed (for cough/cold/flu symptoms).         . Pseudoephedrine HCl (SUDAFED PO)   Oral   Take 1 tablet by mouth every 6 (six) hours as needed (for congestion).         . sertraline (ZOLOFT) 50 MG tablet   Oral   Take 100 mg by mouth daily.          . simvastatin (ZOCOR) 10 MG tablet   Oral   Take 10 mg by mouth at bedtime.  BP 148/70  Pulse 85  Temp(Src) 98 F (36.7 C) (Oral)  Resp 18  SpO2 97%  Physical Exam  Nursing note and vitals reviewed. Constitutional: She is oriented to person, place, and time. She appears well-developed and well-nourished. No distress.  HENT:  Head: Normocephalic and atraumatic.  Eyes: EOM are normal.  Neck: Neck supple. No tracheal deviation present.  Cardiovascular: Normal rate, regular rhythm and normal heart sounds.   Pulmonary/Chest: Effort normal and breath sounds normal. No respiratory distress. She has no wheezes. She has no rhonchi. She has no rales.  Musculoskeletal: Normal range of motion.  Neurological: She is alert and oriented to person, place, and time.  Skin: Skin is warm and dry.  Psychiatric: She has a normal mood and affect. Her behavior is normal.    ED Course  Procedures (including critical care time)  DIAGNOSTIC STUDIES: Oxygen Saturation is  97% on RA, normal by my interpretation.    COORDINATION OF CARE: 11:55 AM-Discussed treatment plan which includes an antibiotic and a nasal spray with pt at bedside and pt agreed to plan.   Labs Review Labs Reviewed - No data to display Imaging Review Dg Chest 2 View  03/20/2013   CLINICAL DATA:  Short of breath for 3 days.  EXAM: CHEST  2 VIEW  COMPARISON:  DG CHEST 2 VIEW dated 05/31/2012; DG CHEST 2 VIEW dated 03/30/2012; DG CHEST 2 VIEW dated 01/13/2012  FINDINGS: Eventration of the hemidiaphragms is present and lower lung volumes on today's exam compared to prior. On the lateral view, there is increased density over the lower thoracic spine. On the frontal view, this is difficult to visualize but may be within the retrocardiac region. This is suspicious for small focus of pneumonia. No pleural effusion. Followup to ensure radiographic clearing and exclude an underlying lesion is recommended. Typically clearing will be observed at 8 weeks.  IMPRESSION: 1. Lower lung volumes. 2. New focus of lower lobe opacity evident on the lateral view is suspicious for small focus of pneumonia.   Electronically Signed   By: Andreas Newport M.D.   On: 03/20/2013 11:31     EKG Interpretation None      MDM   Final diagnoses:  CAP (community acquired pneumonia)  Nasal congestion    Patient with COPD, cough. Possible dx CAP. Will d/c with zpak and afrin. F/u with pcp. Patient appears well, afebrile, hemodynamically stable with 02 sats >90% The patient appears reasonably screened and/or stabilized for discharge and I doubt any other medical condition or other [facility] requiring further screening, evaluation, or treatment in the ED at this time prior to discharge.   I personally performed the services described in this documentation, which was scribed in my presence. The recorded information has been reviewed and is accurate.  Arthor Captain, PA-C 03/21/13 1815

## 2013-03-20 NOTE — ED Notes (Signed)
Patient states started getting sick last Friday.   Patient states she has nasal congestion, cough, chest congestion.   Patient states biggest complaint "I can't breathe out of my nose".   Patient states she took "mucinex, theraflu, ibuprofen, sudafed, alka seltzer and nothing worked".

## 2013-03-23 NOTE — ED Provider Notes (Signed)
Medical screening examination/treatment/procedure(s) were performed by non-physician practitioner and as supervising physician I was immediately available for consultation/collaboration.   EKG Interpretation None       Leda Bellefeuille L Graviela Nodal, MD 03/23/13 1121 

## 2013-05-03 ENCOUNTER — Emergency Department (HOSPITAL_COMMUNITY): Payer: BC Managed Care – PPO

## 2013-05-03 ENCOUNTER — Emergency Department (HOSPITAL_COMMUNITY)
Admission: EM | Admit: 2013-05-03 | Discharge: 2013-05-03 | Disposition: A | Discharge status: Home / Self Care | Payer: BC Managed Care – PPO | Attending: Emergency Medicine | Admitting: Emergency Medicine

## 2013-05-03 ENCOUNTER — Encounter (HOSPITAL_COMMUNITY): Payer: Self-pay | Admitting: Emergency Medicine

## 2013-05-03 DIAGNOSIS — N200 Calculus of kidney: Secondary | ICD-10-CM | POA: Insufficient documentation

## 2013-05-03 DIAGNOSIS — R109 Unspecified abdominal pain: Secondary | ICD-10-CM

## 2013-05-03 DIAGNOSIS — F17200 Nicotine dependence, unspecified, uncomplicated: Secondary | ICD-10-CM | POA: Insufficient documentation

## 2013-05-03 DIAGNOSIS — Z8679 Personal history of other diseases of the circulatory system: Secondary | ICD-10-CM | POA: Insufficient documentation

## 2013-05-03 DIAGNOSIS — Z8701 Personal history of pneumonia (recurrent): Secondary | ICD-10-CM | POA: Insufficient documentation

## 2013-05-03 DIAGNOSIS — Z792 Long term (current) use of antibiotics: Secondary | ICD-10-CM | POA: Insufficient documentation

## 2013-05-03 DIAGNOSIS — R110 Nausea: Secondary | ICD-10-CM | POA: Insufficient documentation

## 2013-05-03 DIAGNOSIS — Z79899 Other long term (current) drug therapy: Secondary | ICD-10-CM | POA: Insufficient documentation

## 2013-05-03 LAB — COMPREHENSIVE METABOLIC PANEL
ALK PHOS: 95 U/L (ref 39–117)
ALT: 26 U/L (ref 0–35)
AST: 23 U/L (ref 0–37)
Albumin: 3.2 g/dL — ABNORMAL LOW (ref 3.5–5.2)
BUN: 16 mg/dL (ref 6–23)
CHLORIDE: 108 meq/L (ref 96–112)
CO2: 23 meq/L (ref 19–32)
Calcium: 9.2 mg/dL (ref 8.4–10.5)
Creatinine, Ser: 0.64 mg/dL (ref 0.50–1.10)
GLUCOSE: 122 mg/dL — AB (ref 70–99)
POTASSIUM: 4 meq/L (ref 3.7–5.3)
SODIUM: 144 meq/L (ref 137–147)
Total Protein: 6.3 g/dL (ref 6.0–8.3)

## 2013-05-03 LAB — URINALYSIS, ROUTINE W REFLEX MICROSCOPIC
BILIRUBIN URINE: NEGATIVE
GLUCOSE, UA: NEGATIVE mg/dL
HGB URINE DIPSTICK: NEGATIVE
Ketones, ur: 15 mg/dL — AB
Leukocytes, UA: NEGATIVE
Nitrite: NEGATIVE
PROTEIN: NEGATIVE mg/dL
Specific Gravity, Urine: 1.032 — ABNORMAL HIGH (ref 1.005–1.030)
UROBILINOGEN UA: 0.2 mg/dL (ref 0.0–1.0)
pH: 5.5 (ref 5.0–8.0)

## 2013-05-03 LAB — CBC WITH DIFFERENTIAL/PLATELET
Basophils Absolute: 0.1 10*3/uL (ref 0.0–0.1)
Basophils Relative: 1 % (ref 0–1)
EOS PCT: 3 % (ref 0–5)
Eosinophils Absolute: 0.2 10*3/uL (ref 0.0–0.7)
HCT: 41.2 % (ref 36.0–46.0)
Hemoglobin: 14.2 g/dL (ref 12.0–15.0)
LYMPHS ABS: 2.6 10*3/uL (ref 0.7–4.0)
LYMPHS PCT: 44 % (ref 12–46)
MCH: 31.7 pg (ref 26.0–34.0)
MCHC: 34.5 g/dL (ref 30.0–36.0)
MCV: 92 fL (ref 78.0–100.0)
Monocytes Absolute: 0.2 10*3/uL (ref 0.1–1.0)
Monocytes Relative: 4 % (ref 3–12)
NEUTROS ABS: 3 10*3/uL (ref 1.7–7.7)
NEUTROS PCT: 48 % (ref 43–77)
PLATELETS: 226 10*3/uL (ref 150–400)
RBC: 4.48 MIL/uL (ref 3.87–5.11)
RDW: 13 % (ref 11.5–15.5)
WBC: 6 10*3/uL (ref 4.0–10.5)

## 2013-05-03 LAB — LIPASE, BLOOD: Lipase: 30 U/L (ref 11–59)

## 2013-05-03 MED ORDER — ONDANSETRON HCL 4 MG/2ML IJ SOLN
4.0000 mg | Freq: Once | INTRAMUSCULAR | Status: AC
  Administered 2013-05-03: 4 mg via INTRAVENOUS
  Filled 2013-05-03: qty 2

## 2013-05-03 MED ORDER — ONDANSETRON 4 MG PO TBDP: 4.0000 mg | ORAL_TABLET | Freq: Three times a day (TID) | ORAL | Status: DC | PRN

## 2013-05-03 MED ORDER — OXYCODONE-ACETAMINOPHEN 5-325 MG PO TABS: 1.0000 | ORAL_TABLET | ORAL | Status: DC | PRN

## 2013-05-03 MED ORDER — MORPHINE SULFATE 4 MG/ML IJ SOLN
4.0000 mg | Freq: Once | INTRAMUSCULAR | Status: AC
Start: 2013-05-03 — End: 2013-05-03
  Administered 2013-05-03: 4 mg via INTRAVENOUS
  Filled 2013-05-03: qty 1

## 2013-05-03 NOTE — ED Provider Notes (Signed)
CSN: [id number]     Arrival date & time 05/03/13  [id number] History   First MD Initiated Contact with Patient 05/03/13 [id number]     Chief Complaint  Patient presents with  . Back Pain  . Abdominal Pain  . Nausea     (Consider location/radiation/quality/duration/timing/severity/associated sxs/prior Treatment) Patient is a 53 y.o. female presenting with back pain and abdominal pain. The history is provided by the patient and medical records.  Back Pain Associated symptoms: abdominal pain   Abdominal Pain Associated symptoms: nausea    This is a 53 year old female with no significant past medical history presenting to the ED for back pain for the past 5 days. Patient states pain mostly localized to her right flank, some radiation to her right upper quadrant, described as sharp and stabbing in nature.  She endorses associated nausea but denies vomiting. Pain is not related to food intake.  Patient states she's been having some urinary frequency and urine has had foul odor. She denies dysuria or hematuria. No fever or chills. No sick contacts at home. No vaginal complaints.  Bowel movements normal. No prior history of kidney stones, states she does drink a large amount of soda, coffee, and teas.  No chest pain or SOB.  Past Medical History  Diagnosis Date  . Angina   . Pneumonia ~ 2003  . Shortness of breath on exertion 05/07/11    "all the time"   Past Surgical History  Procedure Laterality Date  . Salpingoophorectomy  1996  . Cesarean section  1995  . Appendectomy  09/1999  . Colon surgery     Family History  Problem Relation Age of Onset  . Diabetes Mother    History  Substance Use Topics  . Smoking status: Current Every Day Smoker -- 1.00 packs/day for 35 years    Types: Cigarettes  . Smokeless tobacco: Never Used  . 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  Genitourinary: Positive for frequency.  Musculoskeletal: Positive for back pain.  All other systems reviewed and are negative.     Allergies  Review of patient's allergies indicates no known allergies.  Home Medications   Prior to Admission medications   Medication Sig Start Date End Date Taking? Authorizing Provider  azithromycin (ZITHROMAX Z-PAK) 250 MG tablet 2 po day one, then 1 daily x 4 days 03/20/13   Arthor CaptainAbigail Harris, PA-C  buPROPion (WELLBUTRIN XL) 150 MG 24 hr tablet Take 150 mg by mouth daily.    Historical Provider, MD  doxycycline (VIBRAMYCIN) 100 MG capsule Take 100 mg by mouth 2 (two) times daily. For 10 days, starting 03/12/13 03/12/13   Historical Provider, MD  GuaiFENesin (MUCINEX PO) Take 30 mLs by mouth daily as needed (for cough/flu symptoms).    Historical Provider, MD  oxymetazoline (AFRIN NASAL SPRAY) 0.05 % nasal spray Place 1 spray into both nostrils 2 (two) times daily. 03/20/13   Arthor CaptainAbigail Harris, PA-C  Phenyleph-CPM-DM-Aspirin (ALKA-SELTZER PLUS COLD & COUGH PO) Take 1 packet by mouth daily as needed (for cough/cold/flu symptoms).    Historical Provider, MD  Pseudoephedrine HCl (SUDAFED PO) Take 1 tablet by mouth every 6 (six) hours as needed (for congestion).    Historical Provider, MD  sertraline (ZOLOFT) 50 MG tablet Take 100 mg by mouth daily.     Historical Provider, MD  simvastatin (ZOCOR) 10 MG tablet Take 10 mg by mouth at bedtime.    Historical Provider, MD   BP 127/50  Pulse 73  Temp(Src) 97.9 F (36.6 C) (Oral)  Resp 20  Wt 260 lb (117.935 kg)  SpO2 98%  Physical Exam  Nursing note and vitals reviewed. Constitutional: She is oriented to person, place, and time. She appears well-developed and well-nourished.  HENT:  Head: Normocephalic and atraumatic.  Mouth/Throat: Oropharynx is clear and moist.  Eyes: Conjunctivae and EOM are normal. Pupils are equal, round, and reactive to  light.  Neck: Normal range of motion.  Cardiovascular: Normal rate, regular rhythm and normal heart sounds.   Pulmonary/Chest: Effort normal and breath sounds normal. No respiratory distress. She has no wheezes.  Abdominal: Soft. Bowel sounds are normal. There is tenderness. There is CVA tenderness. There is no tenderness at McBurney's point and negative Murphy's sign.    Abdomen soft, non-distended, mild right upper abdominal pain with negative Murphy's sign; right CVA exquisitely tender  Musculoskeletal: Normal range of motion.  Neurological: She is alert and oriented to person, place, and time.  Skin: Skin is warm and dry.  Psychiatric: She has a normal mood and affect.    ED Course  Procedures (including critical care time) Labs Review Labs Reviewed  COMPREHENSIVE METABOLIC PANEL - Abnormal; Notable for the following:    Glucose, Bld 122 (*)    Albumin 3.2 (*)    Total Bilirubin <0.2 (*)    All other components within normal limits  URINALYSIS, ROUTINE W REFLEX MICROSCOPIC - Abnormal; Notable for the following:    Specific Gravity, Urine 1.032 (*)    Ketones, ur 15 (*)    All other components within normal limits  CBC WITH DIFFERENTIAL  LIPASE, BLOOD    Imaging Review Ct Abdomen Pelvis Wo Contrast  05/03/2013   CLINICAL DATA:  BACK PAIN nausea  EXAM: CT ABDOMEN AND PELVIS WITHOUT CONTRAST  TECHNIQUE: Multidetector CT imaging of the abdomen and pelvis was performed following the standard protocol without intravenous contrast.  COMPARISON:  None.  FINDINGS: Minimal hypoventilation within the lung bases otherwise unremarkable.  Noncontrast evaluation of the liver, spleen, adrenals, pancreas is unremarkable.  A nonobstructing 4 mm calculus medullary portion lower pole right kidney. There is no evidence of hydronephrosis, hydroureter nor ureteral lithiasis. The left kidney and left collecting system is unremarkable.  The bowel is negative.  The patient status post appendectomy.  There  is no evidence of abdominal aortic aneurysm.  A small 1.6 cm nonenhancing well-defined cystic focus left adnexa. This is likely a small postop seroma. Image 76 series 2. Otherwise, within the limitations of a noncontrast CT no evidence of abdominal or pelvic free fluid, loculated fluid collections, masses, nor adenopathy. There are areas of diverticulosis within the colon.  Postsurgical changes are appreciated within the pelvis consistent with prior salingoophorectomy and Caesarean section.  There is no evidence of abdominal wall nor inguinal hernia.  No aggressive appearing osseous lesions.  IMPRESSION: A nonobstructing lower pole right kidney calculus. Mild diverticulosis within the colon. Small benign appearing cystic focus left adnexa likely postoperative change. Otherwise no evidence of abdominal or pelvic pathology.   Electronically Signed   By: Salome Holmes M.D.   On: 05/03/2013 13:19     EKG Interpretation None      MDM   Final diagnoses:  Kidney stone  Flank pain  Nausea   Labs obtained which are reassuring. UA without signs of infection.  CT scan showing non-obstructing right renal calculus without hydronephrosis. No ureteral stones. Patients pain is well controlled in the emergency department after morphine and zofran. She remained afebrile and overall nontoxic appearing, repeat abdominal exam improved.  She will be discharged home with pain medication and nausea medication. She will followup with urology if problems occur. Discussed plan with patient, she nausea and understanding and agreed with plan of care. Return precautions given for new or worsening symptoms.  Garlon HatchetLisa M Sanders, PA-C 05/03/13 1525

## 2013-05-03 NOTE — Discharge Instructions (Signed)
Take the prescribed medication as directed as directed for pain and nausea. Follow-up with [facility] if problems occur or if begin experience trouble urinating. Return to the ED for new or worsening symptoms.

## 2013-05-03 NOTE — ED Notes (Signed)
Pt arrives via POV from home with gradual onset of back pain over the weekend. Pt states pain over the last few days began moving to RUQ and also LLQ. Pt reports nausea but no vomiting. Reports subjective fever. Denies dysuria. Pt ambulatory to room, awake, alert, VSS, NAD at present.

## 2013-05-05 NOTE — ED Provider Notes (Signed)
Medical screening examination/treatment/procedure(s) were performed by non-physician practitioner and as supervising physician I was immediately available for consultation/collaboration.    Edd Reppert L Fronie Holstein, MD 05/05/13 1000 

## 2013-05-06 ENCOUNTER — Emergency Department (HOSPITAL_COMMUNITY): Payer: BC Managed Care – PPO

## 2013-05-06 ENCOUNTER — Encounter (HOSPITAL_COMMUNITY): Payer: Self-pay | Admitting: Emergency Medicine

## 2013-05-06 ENCOUNTER — Emergency Department (HOSPITAL_COMMUNITY)
Admission: EM | Admit: 2013-05-06 | Discharge: 2013-05-06 | Disposition: A | Discharge status: Home / Self Care | Payer: BC Managed Care – PPO | Attending: Emergency Medicine | Admitting: Emergency Medicine

## 2013-05-06 DIAGNOSIS — F17200 Nicotine dependence, unspecified, uncomplicated: Secondary | ICD-10-CM | POA: Insufficient documentation

## 2013-05-06 DIAGNOSIS — M549 Dorsalgia, unspecified: Secondary | ICD-10-CM | POA: Insufficient documentation

## 2013-05-06 DIAGNOSIS — R1031 Right lower quadrant pain: Secondary | ICD-10-CM | POA: Insufficient documentation

## 2013-05-06 DIAGNOSIS — Z8701 Personal history of pneumonia (recurrent): Secondary | ICD-10-CM | POA: Insufficient documentation

## 2013-05-06 DIAGNOSIS — R110 Nausea: Secondary | ICD-10-CM | POA: Insufficient documentation

## 2013-05-06 DIAGNOSIS — Z79899 Other long term (current) drug therapy: Secondary | ICD-10-CM | POA: Insufficient documentation

## 2013-05-06 DIAGNOSIS — Z8679 Personal history of other diseases of the circulatory system: Secondary | ICD-10-CM | POA: Insufficient documentation

## 2013-05-06 DIAGNOSIS — R109 Unspecified abdominal pain: Secondary | ICD-10-CM | POA: Insufficient documentation

## 2013-05-06 LAB — CBC WITH DIFFERENTIAL/PLATELET
BASOS PCT: 0 % (ref 0–1)
Basophils Absolute: 0 10*3/uL (ref 0.0–0.1)
Eosinophils Absolute: 0.1 10*3/uL (ref 0.0–0.7)
Eosinophils Relative: 2 % (ref 0–5)
HCT: 45.2 % (ref 36.0–46.0)
Hemoglobin: 15.6 g/dL — ABNORMAL HIGH (ref 12.0–15.0)
Lymphocytes Relative: 39 % (ref 12–46)
Lymphs Abs: 3 10*3/uL (ref 0.7–4.0)
MCH: 31.6 pg (ref 26.0–34.0)
MCHC: 34.5 g/dL (ref 30.0–36.0)
MCV: 91.5 fL (ref 78.0–100.0)
Monocytes Absolute: 0.3 10*3/uL (ref 0.1–1.0)
Monocytes Relative: 4 % (ref 3–12)
NEUTROS PCT: 55 % (ref 43–77)
Neutro Abs: 4.1 10*3/uL (ref 1.7–7.7)
Platelets: 259 10*3/uL (ref 150–400)
RBC: 4.94 MIL/uL (ref 3.87–5.11)
RDW: 13.1 % (ref 11.5–15.5)
WBC: 7.5 10*3/uL (ref 4.0–10.5)

## 2013-05-06 LAB — URINALYSIS, ROUTINE W REFLEX MICROSCOPIC
Bilirubin Urine: NEGATIVE
GLUCOSE, UA: NEGATIVE mg/dL
HGB URINE DIPSTICK: NEGATIVE
KETONES UR: NEGATIVE mg/dL
Leukocytes, UA: NEGATIVE
Nitrite: NEGATIVE
PH: 5.5 (ref 5.0–8.0)
PROTEIN: NEGATIVE mg/dL
Specific Gravity, Urine: 1.018 (ref 1.005–1.030)
Urobilinogen, UA: 0.2 mg/dL (ref 0.0–1.0)

## 2013-05-06 LAB — COMPREHENSIVE METABOLIC PANEL
ALBUMIN: 3.4 g/dL — AB (ref 3.5–5.2)
ALK PHOS: 99 U/L (ref 39–117)
ALT: 29 U/L (ref 0–35)
AST: 24 U/L (ref 0–37)
BUN: 13 mg/dL (ref 6–23)
CO2: 20 mEq/L (ref 19–32)
Calcium: 9.1 mg/dL (ref 8.4–10.5)
Chloride: 104 mEq/L (ref 96–112)
Creatinine, Ser: 0.64 mg/dL (ref 0.50–1.10)
GFR calc Af Amer: >90 mL/min (ref >90)
GFR calc non Af Amer: >90 mL/min (ref >90)
Glucose, Bld: 90 mg/dL (ref 70–99)
POTASSIUM: 4.5 meq/L (ref 3.7–5.3)
Sodium: 138 mEq/L (ref 137–147)
TOTAL PROTEIN: 7 g/dL (ref 6.0–8.3)
Total Bilirubin: <0.2 mg/dL — ABNORMAL LOW (ref 0.3–1.2)

## 2013-05-06 LAB — LIPASE, BLOOD: Lipase: 23 U/L (ref 11–59)

## 2013-05-06 MED ORDER — METHOCARBAMOL 500 MG PO TABS: 500.0000 mg | ORAL_TABLET | Freq: Two times a day (BID) | ORAL | Status: DC

## 2013-05-06 MED ORDER — HYDROMORPHONE HCL PF 1 MG/ML IJ SOLN
1.0000 mg | Freq: Once | INTRAMUSCULAR | Status: AC
  Administered 2013-05-06: 1 mg via INTRAVENOUS
  Filled 2013-05-06: qty 1

## 2013-05-06 MED ORDER — IBUPROFEN 800 MG PO TABS: 800.0000 mg | ORAL_TABLET | Freq: Three times a day (TID) | ORAL | Status: DC

## 2013-05-06 MED ORDER — ONDANSETRON HCL 4 MG/2ML IJ SOLN
4.0000 mg | Freq: Once | INTRAMUSCULAR | Status: AC
  Administered 2013-05-06: 4 mg via INTRAVENOUS
  Filled 2013-05-06: qty 2

## 2013-05-06 NOTE — ED Notes (Addendum)
Pt c/o right sided flank pain, started on Wednesday. Came to the ED for Thursday and told she had a kidney stone. Pt sts she was told to come back if the pain didn't go away. sts she was supposed to make a follow up appointment but didn't feel like calling them. Pt reports she has been drinking plenty of water and taking the pills they gave her. Pt sts the pain medicine didn't do anything but make her go to sleep. Denies seeing blood in her urine. sts she is having slight dysuria. Denies new symptoms from last time she was seen here. Nad, skin warm and dry, resp e/u.

## 2013-05-06 NOTE — ED Provider Notes (Signed)
CSN: [id number]     Arrival date & time 05/06/13  [id number] History   First MD Initiated Contact with Patient 05/06/13 [phone]     Chief Complaint  Patient presents with  . Nephrolithiasis     (Consider location/radiation/quality/duration/timing/severity/associated sxs/prior Treatment) Patient is a 53 y.o. female presenting with abdominal pain. The history is provided by the patient. No language interpreter was used.  Abdominal Pain Pain location:  R flank and RLQ Pain quality: aching   Pain radiates to:  Does not radiate Pain severity:  Moderate Timing:  Constant Progression:  Worsening Chronicity:  New Relieved by:  Nothing Worsened by:  Nothing tried Ineffective treatments:  Acetaminophen Associated symptoms: nausea   Risk factors: no recent hospitalization   Pt complains of severe back pain.   Pt was told she had a stone in her kidney.   Pt thinks she is passing stone.  No relief with hydrocodone.  Past Medical History  Diagnosis Date  . Angina   . Pneumonia ~ 2003  . Shortness of breath on exertion 05/07/11    "all the time"   Past Surgical History  Procedure Laterality Date  . Salpingoophorectomy  1996  . Cesarean section  1995  . Appendectomy  09/1999  . Colon surgery     Family History  Problem Relation Age of Onset  . Diabetes Mother    History  Substance Use Topics  . Smoking status: Current Every Day Smoker -- 1.00 packs/day for 35 years    Types: Cigarettes  . Smokeless tobacco: Never Used  . 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  All other systems reviewed and are negative.     Allergies  Review of patient's allergies indicates no known allergies.  Home Medications   Prior to Admission medications   Medication Sig Start Date End Date Taking? Authorizing  Provider  albuterol (PROVENTIL HFA;VENTOLIN HFA) 108 (90 BASE) MCG/ACT inhaler Inhale 2 puffs into the lungs every 6 (six) hours as needed for wheezing or shortness of breath.   Yes Historical Provider, MD  buPROPion (WELLBUTRIN XL) 150 MG 24 hr tablet Take 150 mg by mouth daily.   Yes Historical Provider, MD  GuaiFENesin (MUCINEX PO) Take 30 mLs by mouth daily as needed (for cough/flu symptoms).   Yes Historical Provider, MD  ibuprofen (ADVIL,MOTRIN) 200 MG tablet Take 600-800 mg by mouth every 6 (six) hours as needed for moderate pain.   Yes Historical Provider, MD  ondansetron (ZOFRAN ODT) 4 MG disintegrating tablet Take 1 tablet (4 mg total) by mouth every 8 (eight) hours as needed for nausea. 05/03/13  Yes Garlon HatchetLisa M Sanders, PA-C  oxyCODONE-acetaminophen (PERCOCET/ROXICET) 5-325 MG per tablet Take 1 tablet by mouth every 4 (four) hours as needed. 05/03/13  Yes Garlon HatchetLisa M Sanders, PA-C  sertraline (ZOLOFT) 50 MG tablet Take 75 mg by mouth daily.    Yes Historical Provider, MD  simvastatin (ZOCOR) 10 MG tablet Take 10 mg by mouth at bedtime.   Yes Historical Provider, MD   BP 98/67  Pulse 82  Temp(Src) 98.1 F (36.7 C) (Oral)  Resp 18  Ht 5\' 8"  (1.727 m)  Wt 270 lb (122.471 kg)  BMI 41.06 kg/m2  SpO2 94%  Physical Exam  Nursing note and vitals reviewed. Constitutional: She is oriented to person, place, and time. She appears well-developed and well-nourished.  HENT:  Head: Normocephalic.  Mouth/Throat: Oropharynx is clear and moist.  Eyes: Conjunctivae and EOM are normal. Pupils are equal, round, and reactive to light.  Neck: Normal range of motion.  Cardiovascular: Normal rate and normal heart sounds.   Pulmonary/Chest: Effort normal.  Abdominal: Soft. Bowel sounds are normal. She exhibits no distension.  Musculoskeletal: Normal range of motion.  Neurological: She is alert and oriented to person, place, and time.  Skin: Skin is warm.  Psychiatric: She has a normal mood and affect.    ED  Course  Procedures (including critical care time) Labs Review Labs Reviewed  CBC WITH DIFFERENTIAL - Abnormal; Notable for the following:    Hemoglobin 15.6 (*)    All other components within normal limits  COMPREHENSIVE METABOLIC PANEL - Abnormal; Notable for the following:    Albumin 3.4 (*)    Total Bilirubin <0.2 (*)    All other components within normal limits  URINALYSIS, ROUTINE W REFLEX MICROSCOPIC  LIPASE, BLOOD    Imaging Review Ct Abdomen Pelvis Wo Contrast  05/06/2013   CLINICAL DATA:  Right flank pain.  History of renal calculi  EXAM: CT ABDOMEN AND PELVIS WITHOUT CONTRAST  TECHNIQUE: Multidetector CT imaging of the abdomen and pelvis was performed following the standard protocol without intravenous contrast.  COMPARISON:  CT ABD/PELV WO CM dated 05/03/2013  FINDINGS: Renal: There is a nonobstructing 3 mm calculus in lower pole of the right kidney not changed from prior. Note ureterolithiasis or obstructive uropathy.  Lung bases are clear.  No pericardial fluid.  Non IV contrast images demonstrate no focal hepatic lesion. The gallbladder, pancreas, spleen, and adrenal glands are normal.  Stomach, small bowel, cecum are normal. The appendix is not identified and may be surgically absent. The colon and rectosigmoid colon are normal.  Abdominal aorta is normal caliber. No retroperitoneal periportal lymphadenopathy.  No free fluid the pelvis. No distal ureteral stones or bladder stones. The uterus and ovaries are unchanged. There are calcifications associated with the left ovary.  No pelvic adenopathy.  No aggressive osseous lesion.  IMPRESSION: 1. No change in right renal calculus. 2. No ureterolithiasis or obstructive uropathy.   Electronically Signed   By: Genevive BiStewart  Edmunds M.D.   On: 05/06/2013 11:10     EKG Interpretation None      Labs Reviewed  CBC WITH DIFFERENTIAL - Abnormal; Notable for the following:    Hemoglobin 15.6 (*)    All other components within normal limits   COMPREHENSIVE METABOLIC PANEL - Abnormal; Notable for the following:    Albumin 3.4 (*)    Total Bilirubin <0.2 (*)    All other components within normal limits  URINALYSIS, ROUTINE W REFLEX MICROSCOPIC  LIPASE, BLOOD    MDM    Final diagnoses:  Right flank pain    I checked labs as well as scan, no stone,  I doubt gallbladder disease.   I think patients pain is muscular.   I will give Percocet due to pt's severity of pain.   Pt advised to see her MD for recheck    Elson AreasLeslie K Jefferson Fullam, PA-C 05/06/13 1125

## 2013-05-06 NOTE — ED Notes (Signed)
She states she was here Thursday and diagnosed with a kidney stone. She continues to have pain. States the vicodin "doesnt help it just puts me to sleep." she did not make an appt to f/u with urology yet

## 2013-05-06 NOTE — ED Notes (Signed)
Pt made aware of need for urine sample and given specimen cup.

## 2013-05-06 NOTE — Discharge Instructions (Signed)
Abdominal Pain, Adult °Many things can cause abdominal pain. Usually, abdominal pain is not caused by a disease and will improve without treatment. It can often be observed and treated at home. Your health care provider will do a physical exam and possibly order blood tests and X-rays to help determine the seriousness of your pain. However, in many cases, more time must pass before a clear cause of the pain can be found. Before that point, your health care provider may not know if you need more testing or further treatment. °HOME CARE INSTRUCTIONS  °Monitor your abdominal pain for any changes. The following actions may help to alleviate any discomfort you are experiencing: °· Only take over-the-counter or prescription medicines as directed by your health care provider. °· Do not take laxatives unless directed to do so by your health care provider. °· Try a clear liquid diet (broth, tea, or water) as directed by your health care provider. Slowly move to a bland diet as tolerated. °SEEK MEDICAL CARE IF: °· You have unexplained abdominal pain. °· You have abdominal pain associated with nausea or diarrhea. °· You have pain when you urinate or have a bowel movement. °· You experience abdominal pain that wakes you in the night. °· You have abdominal pain that is worsened or improved by eating food. °· You have abdominal pain that is worsened with eating fatty foods. °SEEK IMMEDIATE MEDICAL CARE IF:  °· Your pain does not go away within 2 hours. °· You have a fever. °· You keep throwing up (vomiting). °· Your pain is felt only in portions of the abdomen, such as the right side or the left lower portion of the abdomen. °· You pass bloody or black tarry stools. °MAKE SURE YOU: °· Understand these instructions.   °· Will watch your condition.   °· Will get help right away if you are not doing well or get worse.   °Document Released: 10/07/2004 Document Revised: 10/18/2012 Document Reviewed: 09/06/2012 °ExitCare® Patient  Information ©2014 [employer]. ° ° ° ° °Back Pain, Adult °Low back pain is very common. About 1 in 5 people have back pain. The cause of low back pain is rarely dangerous. The pain often gets better over time. About half of people with a sudden onset of back pain feel better in just 2 weeks. About 8 in 10 people feel better by 6 weeks.  °CAUSES °Some common causes of back pain include: °· Strain of the muscles or ligaments supporting the spine. °· Wear and tear (degeneration) of the spinal discs. °· Arthritis. °· Direct injury to the back. °DIAGNOSIS °Most of the time, the direct cause of low back pain is not known. However, back pain can be treated effectively even when the exact cause of the pain is unknown. Answering your caregiver's questions about your overall health and symptoms is one of the most accurate ways to make sure the cause of your pain is not dangerous. If your caregiver needs more information, he or she may order lab work or imaging tests (X-rays or MRIs). However, even if imaging tests show changes in your back, this usually does not require surgery. °HOME CARE INSTRUCTIONS °For many people, back pain returns. Since low back pain is rarely dangerous, it is often a condition that people can learn to manage on their own.  °· Remain active. It is stressful on the back to sit or stand in one place. Do not sit, drive, or stand in one place for more than 30 minutes at a time. Take short walks on level surfaces   as soon as pain allows. Try to increase the length of time you walk each day. °· Do not stay in bed. Resting more than 1 or 2 days can delay your recovery. °· Do not avoid exercise or work. Your body is made to move. It is not dangerous to be active, even though your back may hurt. Your back will likely heal faster if you return to being active before your pain is gone. °· Pay attention to your body when you  bend and lift. Many people have less discomfort when lifting if they bend their knees,  keep the load close to their bodies, and avoid twisting. Often, the most comfortable positions are those that put less stress on your recovering back. °· Find a comfortable position to sleep. Use a firm mattress and lie on your side with your knees slightly bent. If you lie on your back, put a pillow under your knees. °· Only take over-the-counter or prescription medicines as directed by your caregiver. Over-the-counter medicines to reduce pain and inflammation are often the most helpful. Your caregiver may prescribe muscle relaxant drugs. These medicines help dull your pain so you can more quickly return to your normal activities and healthy exercise. °· Put ice on the injured area. °· Put ice in a plastic bag. °· Place a towel between your skin and the bag. °· Leave the ice on for 15-20 minutes, 03-04 times a day for the first 2 to 3 days. After that, ice and heat may be alternated to reduce pain and spasms. °· Ask your caregiver about trying back exercises and gentle massage. This may be of some benefit. °· Avoid feeling anxious or stressed. Stress increases muscle tension and can worsen back pain. It is important to recognize when you are anxious or stressed and learn ways to manage it. Exercise is a great option. °SEEK MEDICAL CARE IF: °· You have pain that is not relieved with rest or medicine. °· You have pain that does not improve in 1 week. °· You have new symptoms. °· You are generally not feeling well. °SEEK IMMEDIATE MEDICAL CARE IF:  °· You have pain that radiates from your back into your legs. °· You develop new bowel or bladder control problems. °· You have unusual weakness or numbness in your arms or legs. °· You develop nausea or vomiting. °· You develop abdominal pain. °· You feel faint. °Document Released: 12/28/2004 Document Revised: 06/29/2011 Document Reviewed: 05/18/2010 °ExitCare® Patient Information ©2014 [employer]. ° °

## 2013-05-06 NOTE — ED Notes (Signed)
Pt returned from radiology.

## 2013-05-06 NOTE — ED Provider Notes (Signed)
Medical screening examination/treatment/procedure(s) were performed by non-physician practitioner and as supervising physician I was immediately available for consultation/collaboration.   EKG Interpretation None       Toy BakerAnthony T Alin Hutchins, MD 05/06/13 1521

## 2013-07-24 ENCOUNTER — Emergency Department (HOSPITAL_COMMUNITY)
Admission: EM | Admit: 2013-07-24 | Discharge: 2013-07-24 | Disposition: A | Discharge status: Home / Self Care | Payer: BC Managed Care – PPO | Attending: Emergency Medicine | Admitting: Emergency Medicine

## 2013-07-24 ENCOUNTER — Encounter (HOSPITAL_COMMUNITY): Payer: Self-pay | Admitting: Emergency Medicine

## 2013-07-24 ENCOUNTER — Emergency Department (HOSPITAL_COMMUNITY): Payer: BC Managed Care – PPO

## 2013-07-24 DIAGNOSIS — Z79899 Other long term (current) drug therapy: Secondary | ICD-10-CM | POA: Insufficient documentation

## 2013-07-24 DIAGNOSIS — Z8701 Personal history of pneumonia (recurrent): Secondary | ICD-10-CM | POA: Insufficient documentation

## 2013-07-24 DIAGNOSIS — Z720 Tobacco use: Secondary | ICD-10-CM

## 2013-07-24 DIAGNOSIS — J209 Acute bronchitis, unspecified: Secondary | ICD-10-CM | POA: Insufficient documentation

## 2013-07-24 DIAGNOSIS — I209 Angina pectoris, unspecified: Secondary | ICD-10-CM | POA: Insufficient documentation

## 2013-07-24 DIAGNOSIS — F17200 Nicotine dependence, unspecified, uncomplicated: Secondary | ICD-10-CM | POA: Insufficient documentation

## 2013-07-24 DIAGNOSIS — J40 Bronchitis, not specified as acute or chronic: Secondary | ICD-10-CM

## 2013-07-24 MED ORDER — AMOXICILLIN 500 MG PO CAPS: 1000.0000 mg | ORAL_CAPSULE | Freq: Two times a day (BID) | ORAL | Status: DC

## 2013-07-24 MED ORDER — HYDROCOD POLST-CHLORPHEN POLST 10-8 MG/5ML PO LQCR: 5.0000 mL | Freq: Two times a day (BID) | ORAL | Status: DC | PRN

## 2013-07-24 NOTE — ED Provider Notes (Signed)
CSN: [id number]     Arrival date & time 07/24/13  0608 History   First MD Initiated Contact with Patient 07/24/13 [phone]     Chief Complaint  Patient presents with  . Cough  . Shortness of Breath     (Consider location/radiation/quality/duration/timing/severity/associated sxs/prior Treatment) Patient is a 53 y.o. female presenting with cough and shortness of breath. The history is provided by the patient. No language interpreter was used.  Cough Cough characteristics:  Dry and non-productive Severity:  Moderate Onset quality:  Gradual Duration:  2 days Timing:  Constant Progression:  Worsening Smoker: yes   Associated symptoms: chest pain, rhinorrhea and shortness of breath   Associated symptoms: no fever, no myalgias and no rash   Associated symptoms comment:  She presents with complaint of cough, nasal congestion causing "shortness of breath because I can't breathe through my nose". She does not feel she is wheezing and reports inhaler use does not improve symptoms. No known fever. No nausea or vomiting.  Shortness of Breath Associated symptoms: chest pain and cough   Associated symptoms: no abdominal pain, no fever, no rash and no vomiting     Past Medical History  Diagnosis Date  . Angina   . Pneumonia ~ 2003  . Shortness of breath on exertion 05/07/11    "all the time"   Past Surgical History  Procedure Laterality Date  . Salpingoophorectomy  1996  . Cesarean section  1995  . Appendectomy  09/1999  . Colon surgery     Family History  Problem Relation Age of Onset  . Diabetes Mother    History  Substance Use Topics  . Smoking status: Current Every Day Smoker -- 1.00 packs/day for 35 years    Types: Cigarettes  . Smokeless tobacco: Never Used  . 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   HENT: Positive for congestion and rhinorrhea.   Respiratory: Positive for cough and shortness of breath.   Cardiovascular: Positive for chest pain. Negative for leg swelling.  Gastrointestinal: Negative for nausea, vomiting and abdominal pain.  Musculoskeletal: Negative for myalgias.  Skin: Negative for rash.      Allergies  Review of patient's allergies indicates no known allergies.  Home Medications   Prior to Admission medications   Medication Sig Start Date End Date Taking? Authorizing Provider  albuterol (PROVENTIL HFA;VENTOLIN HFA) 108 (90 BASE) MCG/ACT inhaler Inhale 2 puffs into the lungs every 6 (six) hours as needed for wheezing or shortness of breath.   Yes Historical Provider, MD  sertraline (ZOLOFT) 50 MG tablet Take 100 mg by mouth daily.    Yes Historical Provider, MD  simvastatin (ZOCOR) 10 MG tablet Take 10 mg by mouth at bedtime.   Yes Historical Provider, MD   BP 118/62  Pulse 65  Temp(Src) 98.5 F (36.9 C) (Oral)  Resp 14  Ht 5\' 8"  (1.727 m)  Wt 260 lb (117.935 kg)  BMI 39.54 kg/m2  SpO2 97% Physical Exam  Constitutional: She is oriented to person, place, and time. She appears well-developed and well-nourished.  HENT:  Head: Normocephalic.  Eyes: Conjunctivae are normal.  Neck: Normal range of motion. Neck supple.  Cardiovascular: Normal rate and regular rhythm.   No murmur heard.  Pulmonary/Chest: Effort normal. No respiratory distress. She has no wheezes. She has rales. She exhibits tenderness.  Abdominal: Soft. Bowel sounds are normal. There is no tenderness. There is no rebound and no guarding.  Musculoskeletal: Normal range of motion.  Neurological: She is alert and oriented to person, place, and time.  Skin: Skin is warm and dry. No rash noted.  Psychiatric: She has a normal mood and affect.    ED Course  Procedures (including critical care time) Labs Review Labs Reviewed - No data to display  Imaging Review Dg Chest 2 View  07/24/2013    CLINICAL DATA:  Cough with shortness of breath for 4 days.  Smoker.  EXAM: CHEST  2 VIEW  COMPARISON:  03/20/2013, 01/13/2012 and 05/07/2011.  FINDINGS: Increased lung markings consistent with chronic lung disease similar to 2013. The previously question abnormality on the lateral view has cleared or represent overlapping structures.  Mild central pulmonary vascular prominence without pulmonary edema.  No segmental infiltrate or pneumothorax.  Mildly tortuous aorta.  Heart size within normal limits.  No plain film evidence of pulmonary malignancy. If there is a persistent unexplained cough followup imaging may be considered.  IMPRESSION: Increased lung markings consistent with chronic lung disease similar to 2013.  Mild central pulmonary vascular prominence without pulmonary edema.  No segmental infiltrate.  Please see above.   Electronically Signed   By: Bridgett LarssonSteve  Olson M.D.   On: 07/24/2013 07:21     EKG Interpretation None      MDM   Final diagnoses:  None    1. Bronchitis 2. Tobacco abuse  Patient is well appearing, non-toxic. Speaking in full sentences with shortness of breath. No wheezing on exam. Will treat for bronchitis based on smoking and asthma history. She is stable for discharge with no hypoxia.     Arnoldo HookerShari A Gustavo Dispenza, PA-C 07/24/13 [id number]

## 2013-07-24 NOTE — ED Notes (Signed)
Pt. reports productive cough with exertional dyspnea / chest congestion onset 2 days ago ,denies fever or chills.

## 2013-07-24 NOTE — ED Provider Notes (Signed)
Medical screening examination/treatment/procedure(s) were performed by non-physician practitioner and as supervising physician I was immediately available for consultation/collaboration.   EKG Interpretation None        Audree CamelScott T Canden Cieslinski, MD 07/24/13 [phone]

## 2013-07-24 NOTE — ED Notes (Signed)
PA at bedside.

## 2013-07-24 NOTE — Discharge Instructions (Signed)
Bronchitis Bronchitis is inflammation of the airways that extend from the windpipe into the lungs (bronchi). The inflammation often causes mucus to develop, which leads to a cough. If the inflammation becomes severe, it may cause shortness of breath. CAUSES  Bronchitis may be caused by:   Viral infections.   Bacteria.   Cigarette smoke.   Allergens, pollutants, and other irritants.  SIGNS AND SYMPTOMS  The most common symptom of bronchitis is a frequent cough that produces mucus. Other symptoms include:  Fever.   Body aches.   Chest congestion.   Chills.   Shortness of breath.   Sore throat.  DIAGNOSIS  Bronchitis is usually diagnosed through a medical history and physical exam. Tests, such as chest X-rays, are sometimes done to rule out other conditions.  TREATMENT  You may need to avoid contact with whatever caused the problem (smoking, for example). Medicines are sometimes needed. These may include:  Antibiotics. These may be prescribed if the condition is caused by bacteria.  Cough suppressants. These may be prescribed for relief of cough symptoms.   Inhaled medicines. These may be prescribed to help open your airways and make it easier for you to breathe.   Steroid medicines. These may be prescribed for those with recurrent (chronic) bronchitis. HOME CARE INSTRUCTIONS  Get plenty of rest.   Drink enough fluids to keep your urine clear or pale yellow (unless you have a medical condition that requires fluid restriction). Increasing fluids may help thin your secretions and will prevent dehydration.   Only take over-the-counter or prescription medicines as directed by your health care provider.  Only take antibiotics as directed. Make sure you finish them even if you start to feel better.  Avoid secondhand smoke, irritating chemicals, and strong fumes. These will make bronchitis worse. If you are a smoker, quit smoking. Consider using nicotine gum or  skin patches to help control withdrawal symptoms. Quitting smoking will help your lungs heal faster.   Put a cool-mist humidifier in your bedroom at night to moisten the air. This may help loosen mucus. Change the water in the humidifier daily. You can also run the hot water in your shower and sit in the bathroom with the door closed for 5-10 minutes.   Follow up with your health care provider as directed.   Wash your hands frequently to avoid catching bronchitis again or spreading an infection to others.  SEEK MEDICAL CARE IF: Your symptoms do not improve after 1 week of treatment.  SEEK IMMEDIATE MEDICAL CARE IF:  Your fever increases.  You have chills.   You have chest pain.   You have worsening shortness of breath.   You have bloody sputum.  You faint.  You have lightheadedness.  You have a severe headache.   You vomit repeatedly. MAKE SURE YOU:   Understand these instructions.  Will watch your condition.  Will get help right away if you are not doing well or get worse. Document Released: 12/28/2004 Document Revised: 10/18/2012 Document Reviewed: 08/22/2012 [facility] Patient Information 2015 [place], [place]. This information is not intended to replace advice given to you by your health care provider. Make sure you discuss any questions you have with your health care provider. Smoking Cessation Quitting smoking is important to your health and has many advantages. However, it is not always easy to quit since nicotine is a very addictive drug. Often times, people try 3 times or more before being able to quit. This document explains the best ways for you  to prepare to quit smoking. Quitting takes hard work and a lot of effort, but you can do it. ADVANTAGES OF QUITTING SMOKING  You will live longer, feel better, and live better.  Your body will feel the impact of quitting smoking almost immediately.  Within 20 minutes, blood pressure decreases. Your pulse returns  to its normal level.  After 8 hours, carbon monoxide levels in the blood return to normal. Your oxygen level increases.  After 24 hours, the chance of having a heart attack starts to decrease. Your breath, hair, and body stop smelling like smoke.  After 48 hours, damaged nerve endings begin to recover. Your sense of taste and smell improve.  After 72 hours, the body is virtually free of nicotine. Your bronchial tubes relax and breathing becomes easier.  After 2 to 12 weeks, lungs can hold more air. Exercise becomes easier and circulation improves.  The risk of having a heart attack, stroke, cancer, or lung disease is greatly reduced.  After 1 year, the risk of coronary heart disease is cut in half.  After 5 years, the risk of stroke falls to the same as a nonsmoker.  After 10 years, the risk of lung cancer is cut in half and the risk of other cancers decreases significantly.  After 15 years, the risk of coronary heart disease drops, usually to the level of a nonsmoker.  If you are pregnant, quitting smoking will improve your chances of having a healthy baby.  The people you live with, especially any children, will be healthier.  You will have extra money to spend on things other than cigarettes. QUESTIONS TO THINK ABOUT BEFORE ATTEMPTING TO QUIT You may want to talk about your answers with your caregiver.  Why do you want to quit?  If you tried to quit in the past, what helped and what did not?  What will be the most difficult situations for you after you quit? How will you plan to handle them?  Who can help you through the tough times? Your family? Friends? A caregiver?  What pleasures do you get from smoking? What ways can you still get pleasure if you quit? Here are some questions to ask your caregiver:  How can you help me to be successful at quitting?  What medicine do you think would be best for me and how should I take it?  What should I do if I need more  help?  What is smoking withdrawal like? How can I get information on withdrawal? GET READY  Set a quit date.  Change your environment by getting rid of all cigarettes, ashtrays, matches, and lighters in your home, car, or work. Do not let people smoke in your home.  Review your past attempts to quit. Think about what worked and what did not. GET SUPPORT AND ENCOURAGEMENT You have a better chance of being successful if you have help. You can get support in many ways.  Tell your family, friends, and co-workers that you are going to quit and need their support. Ask them not to smoke around you.  Get individual, group, or telephone counseling and support. Programs are available at [employer] and health centers. Call your local health department for information about programs in your area.  Spiritual beliefs and practices may help some smokers quit.  Download a "quit meter" on your computer to keep track of quit statistics, such as how long you have gone without smoking, cigarettes not smoked, and money saved.  Get a  self-help book about quitting smoking and staying off of tobacco. LEARN NEW SKILLS AND BEHAVIORS  Distract yourself from urges to smoke. Talk to someone, go for a walk, or occupy your time with a task.  Change your normal routine. Take a different route to work. Drink tea instead of coffee. Eat breakfast in a different place.  Reduce your stress. Take a hot bath, exercise, or read a book.  Plan something enjoyable to do every day. Reward yourself for not smoking.  Explore interactive web-based programs that specialize in helping you quit. GET MEDICINE AND USE IT CORRECTLY Medicines can help you stop smoking and decrease the urge to smoke. Combining medicine with the above behavioral methods and support can greatly increase your chances of successfully quitting smoking.  Nicotine replacement therapy helps deliver nicotine to your body without the negative effects and  risks of smoking. Nicotine replacement therapy includes nicotine gum, lozenges, inhalers, nasal sprays, and skin patches. Some may be available over-the-counter and others require a prescription.  Antidepressant medicine helps people abstain from smoking, but how this works is unknown. This medicine is available by prescription.  Nicotinic receptor partial agonist medicine simulates the effect of nicotine in your brain. This medicine is available by prescription. Ask your caregiver for advice about which medicines to use and how to use them based on your health history. Your caregiver will tell you what side effects to look out for if you choose to be on a medicine or therapy. Carefully read the information on the package. Do not use any other product containing nicotine while using a nicotine replacement product.  RELAPSE OR DIFFICULT SITUATIONS Most relapses occur within the first 3 months after quitting. Do not be discouraged if you start smoking again. Remember, most people try several times before finally quitting. You may have symptoms of withdrawal because your body is used to nicotine. You may crave cigarettes, be irritable, feel very hungry, cough often, get headaches, or have difficulty concentrating. The withdrawal symptoms are only temporary. They are strongest when you first quit, but they will go away within 10-14 days. To reduce the chances of relapse, try to:  Avoid drinking alcohol. Drinking lowers your chances of successfully quitting.  Reduce the amount of caffeine you consume. Once you quit smoking, the amount of caffeine in your body increases and can give you symptoms, such as a rapid heartbeat, sweating, and anxiety.  Avoid smokers because they can make you want to smoke.  Do not let weight gain distract you. Many smokers will gain weight when they quit, usually less than 10 pounds. Eat a healthy diet and stay active. You can always lose the weight gained after you  quit.  Find ways to improve your mood other than smoking. FOR MORE INFORMATION  www.smokefree.gov  Document Released: 12/22/2000 Document Revised: 06/29/2011 Document Reviewed: 04/08/2011 [facility] Patient Information 2015 [place], [place]. This information is not intended to replace advice given to you by your health care provider. Make sure you discuss any questions you have with your health care provider.

## 2014-02-23 ENCOUNTER — Inpatient Hospital Stay (HOSPITAL_COMMUNITY)
Admission: AD | Admit: 2014-02-23 | Discharge: 2014-03-01 | DRG: 885 | Disposition: A | Discharge status: Rehabilitation Facility | Payer: Federal, State, Local not specified - Other | Source: Intra-hospital | Attending: Psychiatry | Admitting: Psychiatry

## 2014-02-23 ENCOUNTER — Emergency Department (HOSPITAL_COMMUNITY)
Admission: EM | Admit: 2014-02-23 | Discharge: 2014-02-23 | Disposition: A | Discharge status: Other Acute Inpatient Hospital | Payer: No Typology Code available for payment source | Attending: Emergency Medicine | Admitting: Emergency Medicine

## 2014-02-23 ENCOUNTER — Encounter (HOSPITAL_COMMUNITY): Payer: Self-pay | Admitting: Behavioral Health

## 2014-02-23 ENCOUNTER — Encounter (HOSPITAL_COMMUNITY): Payer: Self-pay | Admitting: Nurse Practitioner

## 2014-02-23 DIAGNOSIS — F17210 Nicotine dependence, cigarettes, uncomplicated: Secondary | ICD-10-CM | POA: Diagnosis present

## 2014-02-23 DIAGNOSIS — W57XXXA Bitten or stung by nonvenomous insect and other nonvenomous arthropods, initial encounter: Secondary | ICD-10-CM

## 2014-02-23 DIAGNOSIS — Z8701 Personal history of pneumonia (recurrent): Secondary | ICD-10-CM | POA: Insufficient documentation

## 2014-02-23 DIAGNOSIS — F322 Major depressive disorder, single episode, severe without psychotic features: Secondary | ICD-10-CM | POA: Insufficient documentation

## 2014-02-23 DIAGNOSIS — T148 Other injury of unspecified body region: Secondary | ICD-10-CM

## 2014-02-23 DIAGNOSIS — F1020 Alcohol dependence, uncomplicated: Secondary | ICD-10-CM | POA: Diagnosis present

## 2014-02-23 DIAGNOSIS — F1024 Alcohol dependence with alcohol-induced mood disorder: Secondary | ICD-10-CM | POA: Diagnosis present

## 2014-02-23 DIAGNOSIS — E780 Pure hypercholesterolemia: Secondary | ICD-10-CM | POA: Diagnosis present

## 2014-02-23 DIAGNOSIS — Z590 Homelessness: Secondary | ICD-10-CM | POA: Diagnosis not present

## 2014-02-23 DIAGNOSIS — F329 Major depressive disorder, single episode, unspecified: Secondary | ICD-10-CM | POA: Diagnosis present

## 2014-02-23 DIAGNOSIS — F1994 Other psychoactive substance use, unspecified with psychoactive substance-induced mood disorder: Secondary | ICD-10-CM

## 2014-02-23 DIAGNOSIS — Z720 Tobacco use: Secondary | ICD-10-CM | POA: Insufficient documentation

## 2014-02-23 DIAGNOSIS — Z79899 Other long term (current) drug therapy: Secondary | ICD-10-CM | POA: Insufficient documentation

## 2014-02-23 DIAGNOSIS — F411 Generalized anxiety disorder: Secondary | ICD-10-CM | POA: Diagnosis present

## 2014-02-23 DIAGNOSIS — F1423 Cocaine dependence with withdrawal: Secondary | ICD-10-CM | POA: Diagnosis present

## 2014-02-23 DIAGNOSIS — F1420 Cocaine dependence, uncomplicated: Secondary | ICD-10-CM | POA: Diagnosis present

## 2014-02-23 DIAGNOSIS — R45851 Suicidal ideations: Secondary | ICD-10-CM | POA: Insufficient documentation

## 2014-02-23 DIAGNOSIS — F332 Major depressive disorder, recurrent severe without psychotic features: Principal | ICD-10-CM | POA: Diagnosis present

## 2014-02-23 DIAGNOSIS — G4700 Insomnia, unspecified: Secondary | ICD-10-CM | POA: Diagnosis present

## 2014-02-23 DIAGNOSIS — Z792 Long term (current) use of antibiotics: Secondary | ICD-10-CM | POA: Insufficient documentation

## 2014-02-23 DIAGNOSIS — R21 Rash and other nonspecific skin eruption: Secondary | ICD-10-CM | POA: Insufficient documentation

## 2014-02-23 DIAGNOSIS — Z833 Family history of diabetes mellitus: Secondary | ICD-10-CM | POA: Diagnosis not present

## 2014-02-23 DIAGNOSIS — Z8679 Personal history of other diseases of the circulatory system: Secondary | ICD-10-CM | POA: Insufficient documentation

## 2014-02-23 LAB — COMPREHENSIVE METABOLIC PANEL
ALT: 24 U/L (ref 0–35)
AST: 23 U/L (ref 0–37)
Albumin: 3.6 g/dL (ref 3.5–5.2)
Alkaline Phosphatase: 99 U/L (ref 39–117)
Anion gap: 9 (ref 5–15)
BUN: 6 mg/dL (ref 6–23)
CHLORIDE: 107 mmol/L (ref 96–112)
CO2: 23 mmol/L (ref 19–32)
CREATININE: 0.96 mg/dL (ref 0.50–1.10)
Calcium: 9.5 mg/dL (ref 8.4–10.5)
GFR calc Af Amer: 77 mL/min — ABNORMAL LOW (ref >90)
GFR calc non Af Amer: 66 mL/min — ABNORMAL LOW (ref >90)
Glucose, Bld: 90 mg/dL (ref 70–99)
Potassium: 4 mmol/L (ref 3.5–5.1)
Sodium: 139 mmol/L (ref 135–145)
Total Bilirubin: 0.4 mg/dL (ref 0.3–1.2)
Total Protein: 6.9 g/dL (ref 6.0–8.3)

## 2014-02-23 LAB — ETHANOL: Alcohol, Ethyl (B): <5 mg/dL (ref 0–9)

## 2014-02-23 LAB — CBC
HCT: 49.4 % — ABNORMAL HIGH (ref 36.0–46.0)
Hemoglobin: 17.1 g/dL — ABNORMAL HIGH (ref 12.0–15.0)
MCH: 32.4 pg (ref 26.0–34.0)
MCHC: 34.6 g/dL (ref 30.0–36.0)
MCV: 93.7 fL (ref 78.0–100.0)
Platelets: 273 10*3/uL (ref 150–400)
RBC: 5.27 MIL/uL — AB (ref 3.87–5.11)
RDW: 13.5 % (ref 11.5–15.5)
WBC: 5.8 10*3/uL (ref 4.0–10.5)

## 2014-02-23 LAB — ACETAMINOPHEN LEVEL: Acetaminophen (Tylenol), Serum: <10 ug/mL — ABNORMAL LOW (ref 10–30)

## 2014-02-23 LAB — RAPID URINE DRUG SCREEN, HOSP PERFORMED
Amphetamines: NOT DETECTED
Barbiturates: NOT DETECTED
Benzodiazepines: NOT DETECTED
COCAINE: POSITIVE — AB
Opiates: NOT DETECTED
Tetrahydrocannabinol: NOT DETECTED

## 2014-02-23 LAB — SALICYLATE LEVEL

## 2014-02-23 LAB — POC URINE PREG, ED: Preg Test, Ur: NEGATIVE

## 2014-02-23 MED ORDER — ONDANSETRON HCL 4 MG PO TABS: 4.0000 mg | ORAL_TABLET | Freq: Three times a day (TID) | ORAL | Status: DC | PRN

## 2014-02-23 MED ORDER — ACETAMINOPHEN 325 MG PO TABS
650.0000 mg | ORAL_TABLET | Freq: Four times a day (QID) | ORAL | Status: DC | PRN
  Filled 2014-02-23: qty 20

## 2014-02-23 MED ORDER — IBUPROFEN 400 MG PO TABS: 600.0000 mg | ORAL_TABLET | Freq: Three times a day (TID) | ORAL | Status: DC | PRN

## 2014-02-23 MED ORDER — ZOLPIDEM TARTRATE 5 MG PO TABS: 5.0000 mg | ORAL_TABLET | Freq: Every evening | ORAL | Status: DC | PRN

## 2014-02-23 MED ORDER — DIPHENHYDRAMINE HCL 25 MG PO CAPS
25.0000 mg | ORAL_CAPSULE | Freq: Four times a day (QID) | ORAL | Status: DC
  Administered 2014-02-24 – 2014-03-01 (×21): 25 mg via ORAL
  Filled 2014-02-23 (×33): qty 1

## 2014-02-23 MED ORDER — HYDROCORTISONE 1 % EX CREA
TOPICAL_CREAM | CUTANEOUS | Status: DC | PRN
  Administered 2014-02-24 – 2014-02-28 (×3): via TOPICAL
  Filled 2014-02-23: qty 28

## 2014-02-23 MED ORDER — DIPHENHYDRAMINE HCL 25 MG PO CAPS
25.0000 mg | ORAL_CAPSULE | Freq: Once | ORAL | Status: AC
  Administered 2014-02-23: 25 mg via ORAL
  Filled 2014-02-23: qty 1

## 2014-02-23 MED ORDER — ALUM & MAG HYDROXIDE-SIMETH 200-200-20 MG/5ML PO SUSP
30.0000 mL | ORAL | Status: DC | PRN
  Administered 2014-02-24: 30 mL via ORAL
  Filled 2014-02-23: qty 30

## 2014-02-23 MED ORDER — HYDROXYZINE HCL 25 MG PO TABS
25.0000 mg | ORAL_TABLET | Freq: Four times a day (QID) | ORAL | Status: DC | PRN
  Administered 2014-02-24 – 2014-02-25 (×3): 25 mg via ORAL
  Filled 2014-02-23: qty 1
  Filled 2014-02-23: qty 20
  Filled 2014-02-23 (×2): qty 1

## 2014-02-23 MED ORDER — MAGNESIUM HYDROXIDE 400 MG/5ML PO SUSP: 30.0000 mL | Freq: Every day | ORAL | Status: DC | PRN

## 2014-02-23 MED ORDER — ACETAMINOPHEN 325 MG PO TABS: 650.0000 mg | ORAL_TABLET | ORAL | Status: DC | PRN

## 2014-02-23 MED ORDER — ALUM & MAG HYDROXIDE-SIMETH 200-200-20 MG/5ML PO SUSP: 30.0000 mL | ORAL | Status: DC | PRN

## 2014-02-23 NOTE — ED Notes (Signed)
Pt is tearful, states she wants help to stop using crack. seh last used Wednesday. She reports she has had thoughts of hurting herself. Denies thoughts of hurting anyone else. States she feels "lousy" but denies any specific physical complaints

## 2014-02-23 NOTE — [facility] Assessment (Addendum)
[facility] Assessment Progress Note     TTS consult not called in, received at 1810 by TTS.

## 2014-02-23 NOTE — Progress Notes (Signed)
[facility] MD Progress Note  02/23/2014 10:09 PM Connie Rogers  MRN:  [id number] Subjective:  Called to unit to examine pt for rash prior to room placement.Rash noted in ED but not otherwise described or treated PTA. Rash is severely pruritic.Pt has been staying at sister's .Has not has relations in years.There are no pets in th house.She is not aware of any other family members having a rash. Rash is all over trunk and exy tremeties.Pubic area is free of symptoms. Principal Problem: Rash discovered as pt being prepared for bed on 500 unit for admission for Cocaine dependence/Etoh dependence;SIMD/Depression with SI . Diagnosis:  Bed bug bites;Cocaine dependence continuous;Alcohol dependence continuous;SIMD/Depression withSI Patient Active Problem List   Diagnosis Date Noted  . MDD (major depressive disorder) [F32.2] 02/23/2014  . Chest pain [786.5] 05/07/2011  . Heart palpitations [R00.2] 05/07/2011   Total Time spent with patient: 15 minutes   Past Medical History:  Past Medical History  Diagnosis Date  . Angina   . Pneumonia ~ 2003  . Shortness of breath on exertion 05/07/11    "all the time"    Past Surgical History  Procedure Laterality Date  . Salpingoophorectomy  1996  . Cesarean section  1995  . Appendectomy  09/1999  . Colon surgery     Family History:  Family History  Problem Relation Age of Onset  . Diabetes Mother    Social History:  History  Alcohol Use No    Comment: "been clean and sober since 10/16/2007"     History  Drug Use No    Comment: "been clean and sober since 10/16/2007"    History   Social History  . Marital Status: Single    Spouse Name: N/A  . Number of Children: N/A  . Years of Education: N/A   Social History Main Topics  . Smoking status: Current Every Day Smoker -- 1.00 packs/day for 35 years    Types: Cigarettes  . Smokeless tobacco: Never Used  . Alcohol Use: No     Comment: "been clean and sober since 10/16/2007"  . Drug Use: No      Comment: "been clean and sober since 10/16/2007"  . Sexual Activity: Yes    Birth Control/ Protection: Surgical   Other Topics Concern  . None   Social History Narrative   Additional History:    Sleep: altered by CD  Appetite:  Fair   Assessment: See Diagnosis above  Musculoskeletal: Strength & Muscle Tone: within normal limits Gait & Station: normal Patient leans: N/A   Psychiatric Specialty Exam: Physical Exam  Constitutional: She is oriented to person, place, and time.  Obese  HENT:  Head: Normocephalic and atraumatic.  Right Ear: External ear normal.  Left Ear: External ear normal.  Nose: Nose normal.  Eyes: Conjunctivae and EOM are normal. Pupils are equal, round, and reactive to light. Right eye exhibits no discharge. Left eye exhibits no discharge. No scleral icterus.  Neck: Normal range of motion. Neck supple. No JVD present. No thyromegaly present.  Cardiovascular: Normal rate and regular rhythm.   Respiratory: Effort normal. No stridor. No respiratory distress. She has no wheezes.  GI: There is no tenderness. There is no guarding.  Panniculus/obese  Genitourinary:  Dferred  Musculoskeletal: Normal range of motion.  Lymphadenopathy:    She has no cervical adenopathy.  Neurological: She is alert and oriented to person, place, and time. No cranial nerve deficit. She exhibits normal muscle tone. Coordination normal.  Skin: Skin is warm  and dry. Rash noted.  Erythematous papulonodular rash over entire body except face/palms and soles.Lesions are discrete 77m-1.0 cm with a few excoriated from patient scratching  Psychiatric: She has a normal mood and affect. Her behavior is normal. Judgment and thought content normal.    Review of Systems  Constitutional: Positive for malaise/fatigue. Negative for fever, chills, weight loss and diaphoresis.  HENT: Negative.   Eyes: Negative.   Respiratory: Negative.   Cardiovascular: Negative.   Gastrointestinal: Negative.    Genitourinary: Negative.   Musculoskeletal: Negative.   Skin: Positive for rash.  Neurological: Negative.  Negative for weakness.  Endo/Heme/Allergies: Negative.   Psychiatric/Behavioral: Positive for depression, suicidal ideas and substance abuse. Negative for hallucinations. The patient is nervous/anxious and has insomnia.     There were no vitals taken for this visit.There is no weight on file to calculate BMI.  General Appearance: Disheveled  Eye Contact::  Good  Speech:  Clear and Coherent  Volume:  Normal  Mood:  Euthymic  Affect:  Congruent  Thought Process:  Coherent  Orientation:  Full (Time, Place, and Person)  Thought Content:  NA and WDL  Suicidal Thoughts:  Yes.  without intent/plan  Homicidal Thoughts:  No  Memory:  Negative  Judgement:  Impaired  Insight:  Lacking  Psychomotor Activity:  Normal  Concentration:  Fair  Recall:  [employer] Knowledge:Fair  Language: Fair  Akathisia:  Negative  Handed:  Right  AIMS (if indicated):  na  Assets:  Desire for Improvement Social Support  ADL's:  Impaired  Cognition: WNL  Sleep:  Disrupted by chemical dependencies     Current Medications: Current Facility-Administered Medications  Medication Dose Route Frequency Provider Last Rate Last Dose  . acetaminophen (TYLENOL) tablet 650 mg  650 mg Oral Q6H PRN JBenjamine Mola FNP      . alum & mag hydroxide-simeth (MAALOX/MYLANTA) 200-200-20 MG/5ML suspension 30 mL  30 mL Oral Q4H PRN JBenjamine Mola FNP      . [START ON 02/24/2014] diphenhydrAMINE (BENADRYL) capsule 25 mg  25 mg Oral 4 times per day CDara Hoyer PA-C      . hydrocortisone cream 1 %   Topical PRN CDara Hoyer PA-C      . hydrOXYzine (ATARAX/VISTARIL) tablet 25 mg  25 mg Oral Q6H PRN JBenjamine Mola FNP      . magnesium hydroxide (MILK OF MAGNESIA) suspension 30 mL  30 mL Oral Daily PRN JBenjamine Mola FNP        Lab Results:  Results for orders placed or performed during the hospital encounter  of 02/23/14 (from the past 48 hour(s))  CBC     Status: Abnormal   Collection Time: 02/23/14  3:48 PM  Result Value Ref Range   WBC 5.8 4.0 - 10.5 K/uL   RBC 5.27 (H) 3.87 - 5.11 MIL/uL   Hemoglobin 17.1 (H) 12.0 - 15.0 g/dL   HCT 49.4 (H) 36.0 - 46.0 %   MCV 93.7 78.0 - 100.0 fL   MCH 32.4 26.0 - 34.0 pg   MCHC 34.6 30.0 - 36.0 g/dL   RDW 13.5 11.5 - 15.5 %   Platelets 273 150 - 400 K/uL  Comprehensive metabolic panel     Status: Abnormal   Collection Time: 02/23/14  3:48 PM  Result Value Ref Range   Sodium 139 135 - 145 mmol/L   Potassium 4.0 3.5 - 5.1 mmol/L   Chloride 107 96 - 112 mmol/L  CO2 23 19 - 32 mmol/L   Glucose, Bld 90 70 - 99 mg/dL   BUN 6 6 - 23 mg/dL   Creatinine, Ser 0.96 0.50 - 1.10 mg/dL   Calcium 9.5 8.4 - 10.5 mg/dL   Total Protein 6.9 6.0 - 8.3 g/dL   Albumin 3.6 3.5 - 5.2 g/dL   AST 23 0 - 37 U/L   ALT 24 0 - 35 U/L   Alkaline Phosphatase 99 39 - 117 U/L   Total Bilirubin 0.4 0.3 - 1.2 mg/dL   GFR calc non Af Amer 66 (L) >90 mL/min   GFR calc Af Amer 77 (L) >90 mL/min    Comment: (NOTE) The eGFR has been calculated using the CKD EPI equation. This calculation has not been validated in all clinical situations. eGFR's persistently <90 mL/min signify possible Chronic Kidney Disease.    Anion gap 9 5 - 15  Ethanol (ETOH)     Status: None   Collection Time: 02/23/14  3:48 PM  Result Value Ref Range   Alcohol, Ethyl (B) <5 0 - 9 mg/dL    Comment:        LOWEST DETECTABLE LIMIT FOR SERUM ALCOHOL IS 11 mg/dL FOR MEDICAL PURPOSES ONLY   Acetaminophen level     Status: Abnormal   Collection Time: 02/23/14  3:48 PM  Result Value Ref Range   Acetaminophen (Tylenol), Serum <10.0 (L) 10 - 30 ug/mL    Comment:        THERAPEUTIC CONCENTRATIONS VARY SIGNIFICANTLY. A RANGE OF 10-30 ug/mL MAY BE AN EFFECTIVE CONCENTRATION FOR MANY PATIENTS. HOWEVER, SOME ARE BEST TREATED AT CONCENTRATIONS OUTSIDE THIS RANGE. ACETAMINOPHEN CONCENTRATIONS >150 ug/mL AT  4 HOURS AFTER INGESTION AND >50 ug/mL AT 12 HOURS AFTER INGESTION ARE OFTEN ASSOCIATED WITH TOXIC REACTIONS.   Salicylate level     Status: None   Collection Time: 02/23/14  3:48 PM  Result Value Ref Range   Salicylate Lvl <2.9 2.8 - 20.0 mg/dL  Urine rapid drug screen (hosp performed)     Status: Abnormal   Collection Time: 02/23/14  3:52 PM  Result Value Ref Range   Opiates NONE DETECTED NONE DETECTED   Cocaine POSITIVE (A) NONE DETECTED   Benzodiazepines NONE DETECTED NONE DETECTED   Amphetamines NONE DETECTED NONE DETECTED   Tetrahydrocannabinol NONE DETECTED NONE DETECTED   Barbiturates NONE DETECTED NONE DETECTED    Comment:        DRUG SCREEN FOR MEDICAL PURPOSES ONLY.  IF CONFIRMATION IS NEEDED FOR ANY PURPOSE, NOTIFY LAB WITHIN 5 DAYS.        LOWEST DETECTABLE LIMITS FOR URINE DRUG SCREEN Drug Class       Cutoff (ng/mL) Amphetamine      1000 Barbiturate      200 Benzodiazepine   191 Tricyclics       660 Opiates          300 Cocaine          300 THC              50   POC Urine Pregnancy, ED (do NOT order at [facility])     Status: None   Collection Time: 02/23/14  5:01 PM  Result Value Ref Range   Preg Test, Ur NEGATIVE NEGATIVE    Comment:        THE SENSITIVITY OF THIS METHODOLOGY IS >24 mIU/mL     Physical Findings: AIMS: Facial and Oral Movements Muscles of Facial Expression: None, normal Lips and Perioral  Area: None, normal Jaw: None, normal Tongue: None, normal,Extremity Movements Upper (arms, wrists, hands, fingers): None, normal Lower (legs, knees, ankles, toes): None, normal, Trunk Movements Neck, shoulders, hips: None, normal, Overall Severity Severity of abnormal movements (highest score from questions above): None, normal Incapacitation due to abnormal movements: None, normal Patient's awareness of abnormal movements (rate only patient's report): No Awareness, Dental Status Current problems with teeth and/or dentures?: No Does patient usually  wear dentures?: No  CIWA:  CIWA-Ar Total: 0 COWS:  COWS Total Score: 0  Treatment Plan Summary: Daily contact with patient to assess and evaluate symptoms and progress in treatment and Plan Benadryl 25-50 mg po q 4-6 h and topical 1% hydrocortisone cream prn    Medical Decision Making:  Review and summation of old records (2), Established Problem, Worsening (2) and Review of New Medication or Change in Dosage (2)     Alexxander Kurt E 02/23/2014, 10:09 PM
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
## 2014-02-23 NOTE — ED Notes (Signed)
[facility]

## 2014-02-23 NOTE — ED Notes (Signed)
POC pregnancy was negative.

## 2014-02-23 NOTE — ED Provider Notes (Addendum)
CSN: [id number]     Arrival date & time 02/23/14  1536 History   First MD Initiated Contact with Patient 02/23/14 1605     Chief Complaint  Patient presents with  . Suicidal     (Consider location/radiation/quality/duration/timing/severity/associated sxs/prior Treatment) HPI Patient presents to the emergency department with suicidal ideation.  The patient states that she has been using crack over the last few months.  She had been clean for quite a while until November.  Patient states that she has lost her job, her home and her car and she is very depressed over all of this and the fact that she relapsed on crack cocaine.  Patient states that nothing seems make her condition better or worse.  Patient denies chest pain, shortness of breath, weakness, dizziness, headache, blurred vision, neck pain, neck pain, fever, cough, near syncope or syncope.  She states she spends the on antidepressants but does not take them Past Medical History  Diagnosis Date  . Angina   . Pneumonia ~ 2003  . Shortness of breath on exertion 05/07/11    "all the time"   Past Surgical History  Procedure Laterality Date  . Salpingoophorectomy  1996  . Cesarean section  1995  . Appendectomy  09/1999  . Colon surgery     Family History  Problem Relation Age of Onset  . Diabetes Mother    History  Substance Use Topics  . Smoking status: Current Every Day Smoker -- 1.00 packs/day for 35 years    Types: Cigarettes  . Smokeless tobacco: Never Used  . Alcohol Use: No     Comment: "been clean and sober since 10/16/2007"   OB History    No data available     Review of Systems All other systems negative except as documented in the HPI. All pertinent positives and negatives as reviewed in the HPI.    Allergies  Review of patient's allergies indicates no known allergies.  Home Medications   Prior to Admission medications   Medication Sig Start Date End Date Taking? Authorizing Provider  albuterol  (PROVENTIL HFA;VENTOLIN HFA) 108 (90 BASE) MCG/ACT inhaler Inhale 2 puffs into the lungs every 6 (six) hours as needed for wheezing or shortness of breath.    Historical Provider, MD  amoxicillin (AMOXIL) 500 MG capsule Take 2 capsules (1,000 mg total) by mouth 2 (two) times daily. 07/24/13   Shari A Upstill, PA-C  chlorpheniramine-HYDROcodone (TUSSIONEX PENNKINETIC ER) 10-8 MG/5ML LQCR Take 5 mLs by mouth every 12 (twelve) hours as needed for cough. 07/24/13   Shari A Upstill, PA-C  sertraline (ZOLOFT) 50 MG tablet Take 100 mg by mouth daily.     Historical Provider, MD  simvastatin (ZOCOR) 10 MG tablet Take 10 mg by mouth at bedtime.    Historical Provider, MD   BP 130/96 mmHg  Pulse 94  Temp(Src) 98.6 F (37 C) (Oral)  Resp 22  Ht 5\' 8"  (1.727 m)  SpO2 100% Physical Exam  Constitutional: She is oriented to person, place, and time. She appears well-developed and well-nourished. No distress.  HENT:  Head: Atraumatic.  Mouth/Throat: Oropharynx is clear and moist.  Eyes: Pupils are equal, round, and reactive to light.  Neck: Normal range of motion. Neck supple.  Cardiovascular: Normal rate, regular rhythm and normal heart sounds.  Exam reveals no gallop and no friction rub.   No murmur heard. Pulmonary/Chest: Effort normal and breath sounds normal. No respiratory distress. She has no wheezes.  Neurological: She is alert and  oriented to person, place, and time. No cranial nerve deficit. She exhibits normal muscle tone. Coordination normal.  Skin: Skin is warm and dry. Rash noted. No erythema. No pallor.  Psychiatric: Her speech is normal. Thought content is not paranoid and not delusional. She exhibits a depressed mood. She expresses suicidal ideation. She expresses no homicidal ideation. She expresses no suicidal plans.  Nursing note and vitals reviewed.   ED Course  Procedures (including critical care time) Labs Review Labs Reviewed  CBC - Abnormal; Notable for the following:    RBC  5.27 (*)    Hemoglobin 17.1 (*)    HCT 49.4 (*)    All other components within normal limits  COMPREHENSIVE METABOLIC PANEL - Abnormal; Notable for the following:    GFR calc non Af Amer 66 (*)    GFR calc Af Amer 77 (*)    All other components within normal limits  ACETAMINOPHEN LEVEL - Abnormal; Notable for the following:    Acetaminophen (Tylenol), Serum <10.0 (*)    All other components within normal limits  ETHANOL  SALICYLATE LEVEL  URINE RAPID DRUG SCREEN (HOSP PERFORMED)  POC URINE PREG, ED    The patient will need a TTS assessment.  She is advised of the plan and all questions were answered  MDM   Final diagnoses:  None        Carlyle Dolly, PA-C 02/23/14 1712  Hilario Quarry, MD 02/23/14 1910  Carlyle Dolly, PA-C 02/23/14 1953  Hilario Quarry, MD 02/24/14 [phone]

## 2014-02-24 DIAGNOSIS — F322 Major depressive disorder, single episode, severe without psychotic features: Secondary | ICD-10-CM

## 2014-02-24 MED ORDER — SERTRALINE HCL 50 MG PO TABS
50.0000 mg | ORAL_TABLET | Freq: Every day | ORAL | Status: DC
  Administered 2014-02-24 – 2014-02-25 (×2): 50 mg via ORAL
  Filled 2014-02-24 (×4): qty 1

## 2014-02-24 MED ORDER — LORAZEPAM 1 MG PO TABS: 1.0000 mg | ORAL_TABLET | Freq: Four times a day (QID) | ORAL | Status: DC | PRN

## 2014-02-24 MED ORDER — SIMVASTATIN 10 MG PO TABS
10.0000 mg | ORAL_TABLET | Freq: Every day | ORAL | Status: DC
  Administered 2014-02-24 – 2014-02-28 (×5): 10 mg via ORAL
  Filled 2014-02-24 (×6): qty 1
  Filled 2014-02-24: qty 14

## 2014-02-24 NOTE — Progress Notes (Signed)
Patient ID: Connie Rogers, female   DOB: 07/24/1960, 54 y.o.   MRN: [id number]  Adult Psychoeducational Group Note  Date:  02/24/2014 Time: 03:15pm   Group Topic/Focus:  Personal Choices and Values:   The focus of this group is to help patients assess and explore the importance of values in their lives, how their values affect their decisions, how they express their values and what opposes their expression.  Participation Level:  Did Not Attend  Participation Quality: n/a  Affect: n/a  Cognitive: n/a  Insight: n/a  Engagement in Group: n/a  Modes of Intervention:  Discussion, Education, Reality Testing and Support  Additional Comments:  Pt did not attend group, pt was in bed asleep.  Aurora Maskwyman, Farley Crooker E 02/24/2014, 4:16 PM

## 2014-02-24 NOTE — [facility] Group Notes (Signed)
[facility] Group Notes: (Clinical Social Work)   02/24/2014      Type of Therapy:  Group Therapy   Participation Level:  Did Not Attend despite MHT prompting   Ambrose MantleMareida Grossman-Orr, LCSW 02/24/2014, 12:25 PM

## 2014-02-24 NOTE — Progress Notes (Signed)
Patient ID: Connie ChristiansSarah L Dahmen, female   DOB: July 26, 1960, 54 y.o.   MRN: [id number]  54 year old female admitted for suicidal ideation with no plan, substance abuse and depression. She smokes crack and drinks a case of beer daily. She recently relapsed on cocaine after being clean for 8 years. She recently lost her job and is currently homeless. She has been sleeping on her sisters couch and woke up 2 days ago with bug bites all over. Provider on call evaluated pt and determined to be bed bug bites. Pt signed consents and verbalized understanding. She denies any significant medical history other than high cholesterol and depression. Belongings searched and skin assessed. Clothing washed in hot water. Q15 min safety checks initiated. No complaints of pain or discomfort at this time. Will continue to monitor pt.

## 2014-02-24 NOTE — Tx Team (Signed)
Initial Interdisciplinary Treatment Plan   PATIENT STRESSORS: Financial difficulties Medication change or noncompliance Substance abuse   PATIENT STRENGTHS: Average or above average intelligence General fund of knowledge Physical Health   PROBLEM LIST: Problem List/Patient Goals Date to be addressed Date deferred Reason deferred Estimated date of resolution  Substance Abuse 02/24/14     Depression 02/24/14     Suicidal Ideation 02/24/14                                          DISCHARGE CRITERIA:  Improved stabilization in mood, thinking, and/or behavior Verbal commitment to aftercare and medication compliance Withdrawal symptoms are absent or subacute and managed without 24-hour nursing intervention  PRELIMINARY DISCHARGE PLAN: Attend 12-step recovery group Outpatient therapy  PATIENT/FAMIILY INVOLVEMENT: This treatment plan has been presented to and reviewed with the patient, Connie Rogers, and/or family member.  The patient and family have been given the opportunity to ask questions and make suggestions.  Connie Rogers, Connie Rogers T 02/24/2014, 4:57 AM
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
## 2014-02-24 NOTE — H&P (Signed)
Psychiatric Admission Assessment Adult  Patient Identification: Connie Rogers MRN:  [id number] Date of Evaluation:  02/24/2014 Chief Complaint:  MDD Principal Diagnosis: MDD (major depressive disorder) Diagnosis:   Patient Active Problem List   Diagnosis Date Noted  . MDD (major depressive disorder) [F32.2] 02/23/2014  . Bed bug bite [T14.8, W57.XXXA] 02/23/2014  . Cocaine dependence with withdrawal [F14.23] 02/23/2014  . Alcohol dependence with alcohol-induced mood disorder [F10.24] 02/23/2014  . Chest pain [786.5] 05/07/2011  . Heart palpitations [R00.2] 05/07/2011   History of Present Illness: Connie Rogers is an 54 y.o. female that was seen initially in to ED by sister after she reported SI and relapse on cocaine and alcohol after 8 years of sobriety.  She has hx of  relapse in November 2015. Pt reports she has been using cocaine and drinking a case of beer daily.  Per patient, she experienced withdrawal sx of headache, tremor, chills, nausea. Pt stated she has hx of depression as well with hx of suicide attempt by overdose years ago.  She was on Zoloft but admitted to missing doses.  She reports having several stressors in live like job loss and loss of her car.  She is denying SI/HI/AVH and is contracting for safety.    She was seen today and she is in bed.  She has a flat affect and is somnolent.  She denies any adverse reactions to medications and states she is trying to attend groups that are offered on the unit.  There are no disruptive behaviors seen.    Elements:  Location:  Depression, cocaine abuse. Quality:  hopeless, worthless, anxiety. Severity:  severe. Timing:  in the last 2 weeks. Duration:  chronic, intermittent. Context:  "Just had a lot of stress.  I lost my car, I dont like my job.  I just wanted to drink and use drugs.". Associated Signs/Symptoms: Depression Symptoms:  depressed mood, fatigue, feelings of  worthlessness/guilt, hopelessness, suicidal thoughts without plan, anxiety, (Hypo) Manic Symptoms:  Irritable Mood, Labiality of Mood, Anxiety Symptoms:  Excessive Worry, Psychotic Symptoms:  NA PTSD Symptoms: NA Total Time spent with patient: 30 minutes  Past Medical History:  Past Medical History  Diagnosis Date  . Angina   . Pneumonia ~ 2003  . Shortness of breath on exertion 05/07/11    "all the time"    Past Surgical History  Procedure Laterality Date  . Salpingoophorectomy  1996  . Cesarean section  1995  . Appendectomy  09/1999  . Colon surgery     Family History:  Family History  Problem Relation Age of Onset  . Diabetes Mother    Social History:  History  Alcohol Use No    Comment: "been clean and sober since 10/16/2007"     History  Drug Use No    Comment: "been clean and sober since 10/16/2007"    History   Social History  . Marital Status: Single    Spouse Name: N/A  . Number of Children: N/A  . Years of Education: N/A   Social History Main Topics  . Smoking status: Current Every Day Smoker -- 1.00 packs/day for 35 years    Types: Cigarettes  . Smokeless tobacco: Never Used  . Alcohol Use: No     Comment: "been clean and sober since 10/16/2007"  . Drug Use: No     Comment: "been clean and sober since 10/16/2007"  . Sexual Activity: Yes    Birth Control/ Protection: Surgical   Other Topics Concern  .  None   Social History Narrative   Additional Social History:    History of alcohol / drug use?: Yes   Musculoskeletal: Strength & Muscle Tone: within normal limits Gait & Station: normal Patient leans: N/A  Psychiatric Specialty Exam: Physical Exam  Vitals reviewed. Psychiatric: Her behavior is normal.    Review of Systems  Constitutional: Negative.   HENT: Negative.   Eyes: Negative.   Respiratory: Negative.   Cardiovascular: Negative.   Gastrointestinal: Negative.   Genitourinary: Negative.   Musculoskeletal: Negative.    Skin: Negative.   Neurological: Negative.   Endo/Heme/Allergies: Negative.   Psychiatric/Behavioral: Positive for depression. The patient is nervous/anxious.     Blood pressure 120/78, pulse 77, temperature 97.9 F (36.6 C), temperature source Oral, resp. rate 18, height $RemoveBe'5\' 7"'yHzWyOYhd$  (1.702 m), weight 124.739 kg (275 lb).Body mass index is 43.06 kg/(m^2).   General Appearance: Fairly Groomed  Engineer, water:: Good  Speech: Normal Rate  Volume: Normal  Mood: depressed, describes mood as 4/10  Affect: Constricted  Thought Process: Goal Directed and Linear  Orientation: Other: fully alert and attentive  Thought Content: no hallucinations, no delusions  Suicidal Thoughts: No at this time denies any thoughts of hurting self and contracts for safety on unit   Homicidal Thoughts: No  Memory: recent and remote grossly intact   Judgement: Fair  Insight: Present  Psychomotor Activity: Normal  Concentration: Good  Recall: Good  Fund of Knowledge:Good  Language: Good  Akathisia: Negative  Handed: Right  AIMS (if indicated):    Assets: Communication Skills Desire for Improvement Resilience  Sleep:  6 hrs  Cognition: WNL  ADL's: Impaired        Risk to Self: Is patient at risk for suicide?: Yes Risk to Others:   Prior Inpatient Therapy:   Prior Outpatient Therapy:    Alcohol Screening: 1. How often do you have a drink containing alcohol?: 4 or more times a week 2. How many drinks containing alcohol do you have on a typical day when you are drinking?: 5 or 6 3. How often do you have six or more drinks on one occasion?: Weekly Preliminary Score: 5 4. How often during the last year have you found that you were not able to stop drinking once you had started?: Less than monthly 5. How often during the last year have you failed to do what was normally expected from you becasue of drinking?: Monthly 6. How often during the last year have you  needed a first drink in the morning to get yourself going after a heavy drinking session?: Monthly 7. How often during the last year have you had a feeling of guilt of remorse after drinking?: Less than monthly 8. How often during the last year have you been unable to remember what happened the night before because you had been drinking?: Less than monthly 9. Have you or someone else been injured as a result of your drinking?: No 10. Has a relative or friend or a doctor or another health worker been concerned about your drinking or suggested you cut down?: Yes, during the last year Alcohol Use Disorder Identification Test Final Score (AUDIT): 20  Allergies:  No Known Allergies Lab Results:  Results for orders placed or performed during the hospital encounter of 02/23/14 (from the past 48 hour(s))  CBC     Status: Abnormal   Collection Time: 02/23/14  3:48 PM  Result Value Ref Range   WBC 5.8 4.0 - 10.5 K/uL   RBC  5.27 (H) 3.87 - 5.11 MIL/uL   Hemoglobin 17.1 (H) 12.0 - 15.0 g/dL   HCT 49.4 (H) 36.0 - 46.0 %   MCV 93.7 78.0 - 100.0 fL   MCH 32.4 26.0 - 34.0 pg   MCHC 34.6 30.0 - 36.0 g/dL   RDW 13.5 11.5 - 15.5 %   Platelets 273 150 - 400 K/uL  Comprehensive metabolic panel     Status: Abnormal   Collection Time: 02/23/14  3:48 PM  Result Value Ref Range   Sodium 139 135 - 145 mmol/L   Potassium 4.0 3.5 - 5.1 mmol/L   Chloride 107 96 - 112 mmol/L   CO2 23 19 - 32 mmol/L   Glucose, Bld 90 70 - 99 mg/dL   BUN 6 6 - 23 mg/dL   Creatinine, Ser 0.96 0.50 - 1.10 mg/dL   Calcium 9.5 8.4 - 10.5 mg/dL   Total Protein 6.9 6.0 - 8.3 g/dL   Albumin 3.6 3.5 - 5.2 g/dL   AST 23 0 - 37 U/L   ALT 24 0 - 35 U/L   Alkaline Phosphatase 99 39 - 117 U/L   Total Bilirubin 0.4 0.3 - 1.2 mg/dL   GFR calc non Af Amer 66 (L) >90 mL/min   GFR calc Af Amer 77 (L) >90 mL/min    Comment: (NOTE) The eGFR has been calculated using the CKD EPI equation. This calculation has not been validated in all  clinical situations. eGFR's persistently <90 mL/min signify possible Chronic Kidney Disease.    Anion gap 9 5 - 15  Ethanol (ETOH)     Status: None   Collection Time: 02/23/14  3:48 PM  Result Value Ref Range   Alcohol, Ethyl (B) <5 0 - 9 mg/dL    Comment:        LOWEST DETECTABLE LIMIT FOR SERUM ALCOHOL IS 11 mg/dL FOR MEDICAL PURPOSES ONLY   Acetaminophen level     Status: Abnormal   Collection Time: 02/23/14  3:48 PM  Result Value Ref Range   Acetaminophen (Tylenol), Serum <10.0 (L) 10 - 30 ug/mL    Comment:        THERAPEUTIC CONCENTRATIONS VARY SIGNIFICANTLY. A RANGE OF 10-30 ug/mL MAY BE AN EFFECTIVE CONCENTRATION FOR MANY PATIENTS. HOWEVER, SOME ARE BEST TREATED AT CONCENTRATIONS OUTSIDE THIS RANGE. ACETAMINOPHEN CONCENTRATIONS >150 ug/mL AT 4 HOURS AFTER INGESTION AND >50 ug/mL AT 12 HOURS AFTER INGESTION ARE OFTEN ASSOCIATED WITH TOXIC REACTIONS.   Salicylate level     Status: None   Collection Time: 02/23/14  3:48 PM  Result Value Ref Range   Salicylate Lvl <9.1 2.8 - 20.0 mg/dL  Urine rapid drug screen (hosp performed)     Status: Abnormal   Collection Time: 02/23/14  3:52 PM  Result Value Ref Range   Opiates NONE DETECTED NONE DETECTED   Cocaine POSITIVE (A) NONE DETECTED   Benzodiazepines NONE DETECTED NONE DETECTED   Amphetamines NONE DETECTED NONE DETECTED   Tetrahydrocannabinol NONE DETECTED NONE DETECTED   Barbiturates NONE DETECTED NONE DETECTED    Comment:        DRUG SCREEN FOR MEDICAL PURPOSES ONLY.  IF CONFIRMATION IS NEEDED FOR ANY PURPOSE, NOTIFY LAB WITHIN 5 DAYS.        LOWEST DETECTABLE LIMITS FOR URINE DRUG SCREEN Drug Class       Cutoff (ng/mL) Amphetamine      1000 Barbiturate      200 Benzodiazepine   478 Tricyclics       295  Opiates          300 Cocaine          300 THC              50   POC Urine Pregnancy, ED (do NOT order at [facility])     Status: None   Collection Time: 02/23/14  5:01 PM  Result Value Ref Range   Preg  Test, Ur NEGATIVE NEGATIVE    Comment:        THE SENSITIVITY OF THIS METHODOLOGY IS >24 mIU/mL    Current Medications: Current Facility-Administered Medications  Medication Dose Route Frequency Provider Last Rate Last Dose  . acetaminophen (TYLENOL) tablet 650 mg  650 mg Oral Q6H PRN Benjamine Mola, FNP      . alum & mag hydroxide-simeth (MAALOX/MYLANTA) 200-200-20 MG/5ML suspension 30 mL  30 mL Oral Q4H PRN Benjamine Mola, FNP   30 mL at 02/24/14 0839  . diphenhydrAMINE (BENADRYL) capsule 25 mg  25 mg Oral 4 times per day Dara Hoyer, PA-C   25 mg at 02/24/14 1133  . hydrocortisone cream 1 %   Topical PRN Dara Hoyer, PA-C      . hydrOXYzine (ATARAX/VISTARIL) tablet 25 mg  25 mg Oral Q6H PRN Benjamine Mola, FNP   25 mg at 02/24/14 1133  . LORazepam (ATIVAN) tablet 1 mg  1 mg Oral Q6H PRN Myer Peer Kemari Mares, MD      . magnesium hydroxide (MILK OF MAGNESIA) suspension 30 mL  30 mL Oral Daily PRN Benjamine Mola, FNP      . sertraline (ZOLOFT) tablet 50 mg  50 mg Oral Daily Jenne Campus, MD   50 mg at 02/24/14 1257  . simvastatin (ZOCOR) tablet 10 mg  10 mg Oral q1800 Jenne Campus, MD       PTA Medications: Prescriptions prior to admission  Medication Sig Dispense Refill Last Dose  . sertraline (ZOLOFT) 100 MG tablet Take 200 mg by mouth at bedtime.   0 1-2 weeks ago  . simvastatin (ZOCOR) 20 MG tablet Take 20 mg by mouth at bedtime.   10 1-2 weeks ago    Previous Psychotropic Medications: Yes   Substance Abuse History in the last 12 months:  No.    Consequences of Substance Abuse: Withdrawal Symptoms:   Cramps Headaches  Results for orders placed or performed during the hospital encounter of 02/23/14 (from the past 72 hour(s))  CBC     Status: Abnormal   Collection Time: 02/23/14  3:48 PM  Result Value Ref Range   WBC 5.8 4.0 - 10.5 K/uL   RBC 5.27 (H) 3.87 - 5.11 MIL/uL   Hemoglobin 17.1 (H) 12.0 - 15.0 g/dL   HCT 49.4 (H) 36.0 - 46.0 %   MCV 93.7 78.0 -  100.0 fL   MCH 32.4 26.0 - 34.0 pg   MCHC 34.6 30.0 - 36.0 g/dL   RDW 13.5 11.5 - 15.5 %   Platelets 273 150 - 400 K/uL  Comprehensive metabolic panel     Status: Abnormal   Collection Time: 02/23/14  3:48 PM  Result Value Ref Range   Sodium 139 135 - 145 mmol/L   Potassium 4.0 3.5 - 5.1 mmol/L   Chloride 107 96 - 112 mmol/L   CO2 23 19 - 32 mmol/L   Glucose, Bld 90 70 - 99 mg/dL   BUN 6 6 - 23 mg/dL   Creatinine, Ser 0.96 0.50 - 1.10  mg/dL   Calcium 9.5 8.4 - 10.5 mg/dL   Total Protein 6.9 6.0 - 8.3 g/dL   Albumin 3.6 3.5 - 5.2 g/dL   AST 23 0 - 37 U/L   ALT 24 0 - 35 U/L   Alkaline Phosphatase 99 39 - 117 U/L   Total Bilirubin 0.4 0.3 - 1.2 mg/dL   GFR calc non Af Amer 66 (L) >90 mL/min   GFR calc Af Amer 77 (L) >90 mL/min    Comment: (NOTE) The eGFR has been calculated using the CKD EPI equation. This calculation has not been validated in all clinical situations. eGFR's persistently <90 mL/min signify possible Chronic Kidney Disease.    Anion gap 9 5 - 15  Ethanol (ETOH)     Status: None   Collection Time: 02/23/14  3:48 PM  Result Value Ref Range   Alcohol, Ethyl (B) <5 0 - 9 mg/dL    Comment:        LOWEST DETECTABLE LIMIT FOR SERUM ALCOHOL IS 11 mg/dL FOR MEDICAL PURPOSES ONLY   Acetaminophen level     Status: Abnormal   Collection Time: 02/23/14  3:48 PM  Result Value Ref Range   Acetaminophen (Tylenol), Serum <10.0 (L) 10 - 30 ug/mL    Comment:        THERAPEUTIC CONCENTRATIONS VARY SIGNIFICANTLY. A RANGE OF 10-30 ug/mL MAY BE AN EFFECTIVE CONCENTRATION FOR MANY PATIENTS. HOWEVER, SOME ARE BEST TREATED AT CONCENTRATIONS OUTSIDE THIS RANGE. ACETAMINOPHEN CONCENTRATIONS >150 ug/mL AT 4 HOURS AFTER INGESTION AND >50 ug/mL AT 12 HOURS AFTER INGESTION ARE OFTEN ASSOCIATED WITH TOXIC REACTIONS.   Salicylate level     Status: None   Collection Time: 02/23/14  3:48 PM  Result Value Ref Range   Salicylate Lvl <0.8 2.8 - 20.0 mg/dL  Urine rapid drug screen  (hosp performed)     Status: Abnormal   Collection Time: 02/23/14  3:52 PM  Result Value Ref Range   Opiates NONE DETECTED NONE DETECTED   Cocaine POSITIVE (A) NONE DETECTED   Benzodiazepines NONE DETECTED NONE DETECTED   Amphetamines NONE DETECTED NONE DETECTED   Tetrahydrocannabinol NONE DETECTED NONE DETECTED   Barbiturates NONE DETECTED NONE DETECTED    Comment:        DRUG SCREEN FOR MEDICAL PURPOSES ONLY.  IF CONFIRMATION IS NEEDED FOR ANY PURPOSE, NOTIFY LAB WITHIN 5 DAYS.        LOWEST DETECTABLE LIMITS FOR URINE DRUG SCREEN Drug Class       Cutoff (ng/mL) Amphetamine      1000 Barbiturate      200 Benzodiazepine   676 Tricyclics       195 Opiates          300 Cocaine          300 THC              50   POC Urine Pregnancy, ED (do NOT order at [facility])     Status: None   Collection Time: 02/23/14  5:01 PM  Result Value Ref Range   Preg Test, Ur NEGATIVE NEGATIVE    Comment:        THE SENSITIVITY OF THIS METHODOLOGY IS >24 mIU/mL     Observation Level/Precautions:  15 minute checks  Laboratory:  per ED  Psychotherapy:  Group therapy  Medications:  As per medlist  Consultations:  As needed  Discharge Concerns:  Safety  Estimated LOS:  5-7 days  Other:     Psychological Evaluations: Yes  Treatment Plan Summary: Daily contact with patient to assess and evaluate symptoms and progress in treatment and Medication management  Treatment Plan/Recommendations:  Admit for crisis management and mood stabilization. Medication management to re-stabilize current mood symptoms - Zoloft 50 mg QD for depression - Ativan 1 mg PRN anxiety Group counseling sessions for coping skills Medical consults as needed Review and reinstate any pertinent home medications for other health problems   Medical Decision Making:  Established Problem, Stable/Improving (1), Review of Psycho-Social Stressors (1), Discuss test with performing physician (1), Decision to obtain old records (1),  Review and summation of old records (2), Established Problem, Worsening (2) and Review of Medication Regimen & Side Effects (2)  I certify that inpatient services furnished can reasonably be expected to improve the patient's condition.   Kerrie Buffalo MAY, AGNP-BC 2/14/20162:59 PM  I have discussed case with NP as above, and have met with patient. Agree with NP's note, assessment, plan Patient is a 54 year old female, presenting with worsening depression. History of relapse on cocaine and alcohol several weeks ago, after a prolonged period of sobriety. Dx- Depression NOS, consider MDD versus Cocaine Induced Mood Disorder, Cocaine Abuse  Plan- will start on Zoloft for management of depression. Monitor daily.

## 2014-02-25 DIAGNOSIS — F332 Major depressive disorder, recurrent severe without psychotic features: Secondary | ICD-10-CM | POA: Diagnosis present

## 2014-02-25 DIAGNOSIS — F1420 Cocaine dependence, uncomplicated: Secondary | ICD-10-CM | POA: Diagnosis present

## 2014-02-25 DIAGNOSIS — F1020 Alcohol dependence, uncomplicated: Secondary | ICD-10-CM | POA: Diagnosis present

## 2014-02-25 MED ORDER — TRAZODONE HCL 100 MG PO TABS
100.0000 mg | ORAL_TABLET | Freq: Every day | ORAL | Status: DC
  Administered 2014-02-25 – 2014-02-28 (×4): 100 mg via ORAL
  Filled 2014-02-25: qty 14
  Filled 2014-02-25 (×5): qty 1

## 2014-02-25 MED ORDER — SERTRALINE HCL 100 MG PO TABS
100.0000 mg | ORAL_TABLET | Freq: Every day | ORAL | Status: DC
  Administered 2014-02-26 – 2014-02-27 (×2): 100 mg via ORAL
  Filled 2014-02-25 (×4): qty 1

## 2014-02-25 MED ORDER — TRAZODONE HCL 50 MG PO TABS: 50.0000 mg | ORAL_TABLET | Freq: Every evening | ORAL | Status: DC | PRN

## 2014-02-25 MED ORDER — LORAZEPAM 1 MG PO TABS
1.0000 mg | ORAL_TABLET | Freq: Four times a day (QID) | ORAL | Status: DC | PRN
  Administered 2014-02-25 – 2014-02-27 (×3): 1 mg via ORAL
  Filled 2014-02-25 (×3): qty 1

## 2014-02-25 NOTE — [facility] Group Notes (Signed)
   [facility]  02/25/2014  8:45 AM   Participation Quality: Alert, Appropriate and Oriented  Mood/Affect: Depressed and Flat  Depression Rating: Did not feel comfortable discussing in group   Anxiety Rating: Did not feel comfortable discussing in group  Thoughts of Suicide: Pt denies SI/HI  Will you contract for safety? Yes  Current AVH: Pt denies  Plan for Discharge/Comments: Pt attended discharge planning group and actively participated in group. CSW provided pt with today's workbook. Did not feel comfortable discussing her discharge plans in group.  Transportation Means: CSW continuing to assess  Supports: No supports mentioned at this time  Samuella BruinKristin Dura Mccormack, MSW, [employer] Inc[employer]CSWA Clinical Social Worker [facility] [phone]

## 2014-02-25 NOTE — Clinical Social Work Note (Signed)
[facility] patient request.  Samuella BruinKristin Dalexa Gentz, MSW, [facility] Clinical Social Worker [facility] [phone]

## 2014-02-25 NOTE — [facility] Group Notes (Signed)
[facility] LCSW Group Therapy 02/25/2014  1:15 PM   Type of Therapy: Group Therapy  Participation Level: Did Not Attend. Patient invited to participate but declined.   Helayna Dun, MSW, LCSWA Clinical Social Worker [facility] [phone]   

## 2014-02-25 NOTE — [facility] Suicide Risk Assessment (Signed)
[facility] INPATIENT:  Family/Significant Other Suicide Prevention Education  Suicide Prevention Education:  Patient Refusal for Family/Significant Other Suicide Prevention Education: The patient Connie Rogers has refused to provide written consent for family/significant other to be provided Family/Significant Other Suicide Prevention Education during admission and/or prior to discharge.  Physician notified. SPE reviewed with patient and brochure provided. Patient encouraged to return to hospital if having suicidal thoughts, patient verbalized his/her understanding and has no further questions at this time.   Sukhman Kocher, [place] CarboKristin L 02/25/2014, 12:16 PM

## 2014-02-25 NOTE — Plan of Care (Signed)
Problem: Diagnosis: Increased Risk For Suicide Attempt Goal: STG-Patient Will Attend All Groups On The Unit Outcome: Not Progressing Pt did not attend evening group on 02/24/14.

## 2014-02-25 NOTE — Progress Notes (Signed)
Adult Psychoeducational Group Note  Date:  02/25/2014 Time:  9:18 PM  Group Topic/Focus:  Wrap-Up Group:   The focus of this group is to help patients review their daily goal of treatment and discuss progress on daily workbooks.  Participation Level:  Did Not Attend  Participation Quality:  Pt did not attend  Affect:  Pt did not attend  Cognitive:  Pt did not attend  Insight: None  Engagement in Group:  pt did not attend  Modes of Intervention:  Pt did not attend  Additional Comments:  Pt did not attend group  Janica Eldred A 02/25/2014, 9:18 PM

## 2014-02-25 NOTE — Progress Notes (Signed)
D: Pt has depressed affect and mood.  Pt describes her mood as "drowsy."  Pt states "not right now" when asked if she was having thoughts of suicide.  Pt verbally contracted for safety.  Pt denies HI, denies hallucinations.  She has been isolative to her room with minimal interactions with staff and peers.   A: Medication administered per order.  Safety maintained.  Met with pt 1:1 and offered support and encouragement.   R: Pt was compliant with medications.  She verbally contracted for safety.  Will continue to monitor and assess for safety.

## 2014-02-25 NOTE — Progress Notes (Signed)
[facility] [address] MD Progress Note  02/25/2014 3:45 PM Connie Rogers  MRN:  [id number] Subjective: Patient states "I feel so guilty that I relapsed , I feel worthless and depressed.'  Objective; Patient seen and chart reviewed.Pt discussed with treatment team. Pt presented after feeling suicidal and having worsening depressive sx. Pt with relapse on alcohol as well as cocaine. Pt this AM very tearful and anxious about her substance abuse and feels worthless and guilty this AM. Pt is very motivated to get help. CSW will work on referrals to substance abuse programs. Pt also reports several psychosocial stressors , mostly caused by her alcoholism and cocaine abuse. Pt with recent job loss , financial issues due to the same as well as homelessness .  Per staff , pt continues to be depressed , but is more interactive , is compliant on medications. No withdrawal sx noted. VS reviewed. Will place on CIWA - Every shift as well as Ativan if CIWA >/= 15. BAL <5    Principal Problem: MDD (major depressive disorder), recurrent severe, without psychosis Diagnosis:   Patient Active Problem List   Diagnosis Date Noted  . Alcohol use disorder, severe, dependence [F10.20] 02/25/2014  . Cocaine use disorder, moderate, dependence [F14.20] 02/25/2014  . MDD (major depressive disorder), recurrent severe, without psychosis [F33.2] 02/25/2014  . Bed bug bite [T14.8, W57.XXXA] 02/23/2014   Total Time spent with patient: 30 minutes   Past Medical History:  Past Medical History  Diagnosis Date  . Angina   . Pneumonia ~ 2003  . Shortness of breath on exertion 05/07/11    "all the time"    Past Surgical History  Procedure Laterality Date  . Salpingoophorectomy  1996  . Cesarean section  1995  . Appendectomy  09/1999  . Colon surgery     Family History:  Family History  Problem Relation Age of Onset  . Diabetes Mother    Social History:  History  Alcohol Use No    Comment: "been clean and sober since  10/16/2007"     History  Drug Use No    Comment: "been clean and sober since 10/16/2007"    History   Social History  . Marital Status: Single    Spouse Name: N/A  . Number of Children: N/A  . Years of Education: N/A   Social History Main Topics  . Smoking status: Current Every Day Smoker -- 1.00 packs/day for 35 years    Types: Cigarettes  . Smokeless tobacco: Never Used  . Alcohol Use: No     Comment: "been clean and sober since 10/16/2007"  . Drug Use: No     Comment: "been clean and sober since 10/16/2007"  . Sexual Activity: Yes    Birth Control/ Protection: Surgical   Other Topics Concern  . None   Social History Narrative   Additional History:    Sleep: Poor  Appetite:  Fair    Musculoskeletal: Strength & Muscle Tone: within normal limits Gait & Station: normal Patient leans: N/A   Psychiatric Specialty Exam: Physical Exam  Review of Systems  Constitutional: Negative.   HENT: Negative.   Respiratory: Negative.   Cardiovascular: Negative.   Genitourinary: Negative.   Skin: Negative.   Neurological: Negative for tremors.  Psychiatric/Behavioral: Positive for depression, hallucinations and substance abuse. Negative for suicidal ideas. The patient is nervous/anxious and has insomnia.     Blood pressure 106/72, pulse 86, temperature 97.9 F (36.6 C), temperature source Oral, resp. rate 18, height $RemoveBe'5\' 7"'mqnOkLJAE$  (1.702  m), weight 124.739 kg (275 lb).Body mass index is 43.06 kg/(m^2).  General Appearance: Fairly Groomed  Patent attorney::  Poor  Speech:  Clear and Coherent  Volume:  Decreased  Mood:  Anxious and Depressed  Affect:  Tearful  Thought Process:  Goal Directed  Orientation:  Full (Time, Place, and Person)  Thought Content:  Rumination  Suicidal Thoughts:  No  Homicidal Thoughts:  No  Memory:  Immediate;   Fair Recent;   Fair Remote;   Fair  Judgement:  Impaired  Insight:  Fair  Psychomotor Activity:  Normal  Concentration:  Fair  Recall:  [employer] of Knowledge:Fair  Language: Fair  Akathisia:  No  Handed:  Right  AIMS (if indicated):     Assets:  Communication Skills Desire for Improvement  ADL's:  Intact  Cognition: WNL  Sleep:  Number of Hours: 6.75     Current Medications: Current Facility-Administered Medications  Medication Dose Route Frequency Provider Last Rate Last Dose  . acetaminophen (TYLENOL) tablet 650 mg  650 mg Oral Q6H PRN Beau Fanny, FNP      . alum & mag hydroxide-simeth (MAALOX/MYLANTA) 200-200-20 MG/5ML suspension 30 mL  30 mL Oral Q4H PRN Beau Fanny, FNP   30 mL at 02/24/14 0839  . diphenhydrAMINE (BENADRYL) capsule 25 mg  25 mg Oral 4 times per day Court Joy, PA-C   25 mg at 02/25/14 1214  . hydrocortisone cream 1 %   Topical PRN Court Joy, PA-C      . hydrOXYzine (ATARAX/VISTARIL) tablet 25 mg  25 mg Oral Q6H PRN Beau Fanny, FNP   25 mg at 02/25/14 0831  . LORazepam (ATIVAN) tablet 1 mg  1 mg Oral Q6H PRN Connie Clausing, MD      . magnesium hydroxide (MILK OF MAGNESIA) suspension 30 mL  30 mL Oral Daily PRN Beau Fanny, FNP      . [START ON 02/26/2014] sertraline (ZOLOFT) tablet 100 mg  100 mg Oral Daily Connie Wheless, MD      . simvastatin (ZOCOR) tablet 10 mg  10 mg Oral q1800 Connie Cotta, MD   10 mg at 02/24/14 1758  . traZODone (DESYREL) tablet 100 mg  100 mg Oral QHS Connie Longs, MD        Lab Results:  Results for orders placed or performed during the hospital encounter of 02/23/14 (from the past 48 hour(s))  CBC     Status: Abnormal   Collection Time: 02/23/14  3:48 PM  Result Value Ref Range   WBC 5.8 4.0 - 10.5 K/uL   RBC 5.27 (H) 3.87 - 5.11 MIL/uL   Hemoglobin 17.1 (H) 12.0 - 15.0 g/dL   HCT 96.9 (H) 40.9 - 82.8 %   MCV 93.7 78.0 - 100.0 fL   MCH 32.4 26.0 - 34.0 pg   MCHC 34.6 30.0 - 36.0 g/dL   RDW 67.5 19.8 - 24.2 %   Platelets 273 150 - 400 K/uL  Comprehensive metabolic panel     Status: Abnormal   Collection Time: 02/23/14  3:48 PM   Result Value Ref Range   Sodium 139 135 - 145 mmol/L   Potassium 4.0 3.5 - 5.1 mmol/L   Chloride 107 96 - 112 mmol/L   CO2 23 19 - 32 mmol/L   Glucose, Bld 90 70 - 99 mg/dL   BUN 6 6 - 23 mg/dL   Creatinine, Ser 9.98 0.50 - 1.10 mg/dL   Calcium  9.5 8.4 - 10.5 mg/dL   Total Protein 6.9 6.0 - 8.3 g/dL   Albumin 3.6 3.5 - 5.2 g/dL   AST 23 0 - 37 U/L   ALT 24 0 - 35 U/L   Alkaline Phosphatase 99 39 - 117 U/L   Total Bilirubin 0.4 0.3 - 1.2 mg/dL   GFR calc non Af Amer 66 (L) >90 mL/min   GFR calc Af Amer 77 (L) >90 mL/min    Comment: (NOTE) The eGFR has been calculated using the CKD EPI equation. This calculation has not been validated in all clinical situations. eGFR's persistently <90 mL/min signify possible Chronic Kidney Disease.    Anion gap 9 5 - 15  Ethanol (ETOH)     Status: None   Collection Time: 02/23/14  3:48 PM  Result Value Ref Range   Alcohol, Ethyl (B) <5 0 - 9 mg/dL    Comment:        LOWEST DETECTABLE LIMIT FOR SERUM ALCOHOL IS 11 mg/dL FOR MEDICAL PURPOSES ONLY   Acetaminophen level     Status: Abnormal   Collection Time: 02/23/14  3:48 PM  Result Value Ref Range   Acetaminophen (Tylenol), Serum <10.0 (L) 10 - 30 ug/mL    Comment:        THERAPEUTIC CONCENTRATIONS VARY SIGNIFICANTLY. A RANGE OF 10-30 ug/mL MAY BE AN EFFECTIVE CONCENTRATION FOR MANY PATIENTS. HOWEVER, SOME ARE BEST TREATED AT CONCENTRATIONS OUTSIDE THIS RANGE. ACETAMINOPHEN CONCENTRATIONS >150 ug/mL AT 4 HOURS AFTER INGESTION AND >50 ug/mL AT 12 HOURS AFTER INGESTION ARE OFTEN ASSOCIATED WITH TOXIC REACTIONS.   Salicylate level     Status: None   Collection Time: 02/23/14  3:48 PM  Result Value Ref Range   Salicylate Lvl <1.4 2.8 - 20.0 mg/dL  Urine rapid drug screen (hosp performed)     Status: Abnormal   Collection Time: 02/23/14  3:52 PM  Result Value Ref Range   Opiates NONE DETECTED NONE DETECTED   Cocaine POSITIVE (A) NONE DETECTED   Benzodiazepines NONE DETECTED  NONE DETECTED   Amphetamines NONE DETECTED NONE DETECTED   Tetrahydrocannabinol NONE DETECTED NONE DETECTED   Barbiturates NONE DETECTED NONE DETECTED    Comment:        DRUG SCREEN FOR MEDICAL PURPOSES ONLY.  IF CONFIRMATION IS NEEDED FOR ANY PURPOSE, NOTIFY LAB WITHIN 5 DAYS.        LOWEST DETECTABLE LIMITS FOR URINE DRUG SCREEN Drug Class       Cutoff (ng/mL) Amphetamine      1000 Barbiturate      200 Benzodiazepine   431 Tricyclics       540 Opiates          300 Cocaine          300 THC              50   POC Urine Pregnancy, ED (do NOT order at [facility])     Status: None   Collection Time: 02/23/14  5:01 PM  Result Value Ref Range   Preg Test, Ur NEGATIVE NEGATIVE    Comment:        THE SENSITIVITY OF THIS METHODOLOGY IS >24 mIU/mL     Physical Findings: AIMS: Facial and Oral Movements Muscles of Facial Expression: None, normal Lips and Perioral Area: None, normal Jaw: None, normal Tongue: None, normal,Extremity Movements Upper (arms, wrists, hands, fingers): None, normal Lower (legs, knees, ankles, toes): None, normal, Trunk Movements Neck, shoulders, hips: None, normal, Overall Severity Severity of  abnormal movements (highest score from questions above): None, normal Incapacitation due to abnormal movements: None, normal Patient's awareness of abnormal movements (rate only patient's report): No Awareness, Dental Status Current problems with teeth and/or dentures?: No Does patient usually wear dentures?: No  CIWA:  CIWA-Ar Total: 3 COWS:  COWS Total Score: 0  Assessment: Patient is a 74 y old AAF with recent relapse on alcohol and cocaine. Pt also with very depressed mood , lot of guilt issues secondary to her relapse. Pt is very motivated to get help with regards to her substance abuse. Pt with BAL <5 and UDS pos - cocaine . Pt denies any withdrawal sx. CIWA reviewed.      Treatment Plan Summary: Daily contact with patient to assess and evaluate symptoms and  progress in treatment and Medication management  Reviewed past medical records,treatment plan. Reviewed H&p per May NP.  Will increase the Zoloft to 100 mg po daily for affective sx. Will increase Trazodone to 100 mg po qhs for sleep. Will continue Vistaril prn for anxiety.  Will start CIWA Qshift as well as Ativan as needed for CIWA>/= 15. Will continue to monitor vitals ,medication compliance and treatment side effects while patient is here.    Will monitor for medical issues as well as call consult as needed.  Reviewed labs ,will order TSH , pt with hx of elevated TSH in 2013.   CSW will start working on disposition. Possible placement at substance abuse programs - referral send to [facility]. Patient to participate in therapeutic milieu .        Medical Decision Making:  Review of Psycho-Social Stressors (1), Review or order clinical lab tests (1), Established Problem, Worsening (2), Review of Last Therapy Session (1), Review of Medication Regimen & Side Effects (2) and Review of New Medication or Change in Dosage (2)     Briauna Gilmartin md 02/25/2014, 3:45 PM

## 2014-02-25 NOTE — Progress Notes (Signed)
Patient ID: Connie Rogers, female   DOB: 1960-04-02, 54 y.o.   MRN: [id number] D: Patient in room sleeping on approach. When awoken pt ate snack and interacted with peers. Pt presented with depressed mood and flat affect. Pt denies any withdrawal symptoms. Pt denies SI/HI/AVH and pain.  Cooperative with assessment.   A: Met with pt 1:1. Medications administered as prescribed. Writer encouraged pt to discuss feelings. Pt encouraged to come to staff with any question or concerns.   R: Patient remains safe. She is complaint with medications and denies any adverse reaction. Pt did not attend evening wrap up group.
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
## 2014-02-25 NOTE — Progress Notes (Signed)
D: Pt presents with flat affect and depressed mood. Pt observed resting in bed this morning. Pt reported that she slept well last night. Pt reports depressed 7/10 and states that it's "off and on". Pt denies having suicidal thoughts this morning. Pt appears disheveled and noted to be wearing scrubs. Pt compliant with taking morning meds and denies any adverse reactions to meds. Pt noted to be scratching this morning due to previous bug bites. Pt given topical ointment for itching.  A: Medications administered as ordered per MD. Prn med vistaril given for anxiety this morning at pt's request. Pt encouraged to attend groups. Verbal support given. 15 minute checks performed for safety.  R: Pt verbally agrees not to harm self.

## 2014-02-25 NOTE — Progress Notes (Signed)
Nutrition Brief Note  Patient identified on the Malnutrition Screening Tool (MST) Report  Pt with weight gain per weight history documentation.   Wt Readings from Last 15 Encounters:  02/23/14 275 lb (124.739 kg)  07/24/13 260 lb (117.935 kg)  05/06/13 270 lb (122.471 kg)  05/03/13 260 lb (117.935 kg)  06/02/12 260 lb (117.935 kg)  05/31/12 260 lb (117.935 kg)  03/30/12 270 lb (122.471 kg)  05/07/11 288 lb 12.8 oz (130.999 kg)    Body mass index is 43.06 kg/(m^2). Patient meets criteria for morbid obesity based on current BMI.   Diet Order: Diet regular Pt is also offered choice of unit snacks mid-morning and mid-afternoon.  Pt is eating as desired.  Labs and medications reviewed.   No nutrition interventions warranted at this time. If nutrition issues arise, please consult RD.   Tilda FrancoLindsey Jazlin Tapscott, MS, RD, LDN Pager: [phone] After Hours Pager: [phone]

## 2014-02-26 DIAGNOSIS — F332 Major depressive disorder, recurrent severe without psychotic features: Principal | ICD-10-CM

## 2014-02-26 DIAGNOSIS — F1024 Alcohol dependence with alcohol-induced mood disorder: Secondary | ICD-10-CM

## 2014-02-26 DIAGNOSIS — F1420 Cocaine dependence, uncomplicated: Secondary | ICD-10-CM

## 2014-02-26 LAB — TSH: TSH: 5.326 u[IU]/mL — AB (ref 0.350–4.500)

## 2014-02-26 NOTE — Progress Notes (Addendum)
Patient ID: Connie ChristiansSarah L Kirshner, female   DOB: 12/12/1960, 54 y.o.   [facility] [id number] [facility]  02/26/2014 9:23 AM Connie ChristiansSarah L Fyock  MRN:  [id number] Subjective:  Patient describes some improvement, but continues to report feeling sad, " down" and describes ongoing passive thoughts of death , but denies any actual plan or intention to hurt self and contracts for safety on unit. Denies medication side effects. States she is worried about disposition planning options . States " I used all my money of drugs so I stopped paying rent and I was evicted. Now I am homeless". Reports ongoing cravings for cocaine in particular.  Objective; Patient seen and chart reviewed.Pt discussed with treatment team.  Pt presents with partial improvement compared to admission, and is less tearful and less severely constricted in affect. She does, however, report ongoing depression, sense of sadness, guilty ruminations about her recent relapse, and as noted, some ongoing passive thoughts of death. Participation in milieu has been limited. Tends to isolate in her room. At this time describes mild nausea and feeling vaguely " jittery", as residual withdrawal symptoms. She is not presenting with any significant tremors or diaphoresis and does not appear to be in any acute distress.  Vitals are stable. Denies medication side effects and feels medications are helping " a little".   Principal Problem: MDD (major depressive disorder), recurrent severe, without psychosis Diagnosis:   Patient Active Problem List   Diagnosis Date Noted  . Alcohol use disorder, severe, dependence [F10.20] 02/25/2014  . Cocaine use disorder, moderate, dependence [F14.20] 02/25/2014  . MDD (major depressive disorder), recurrent severe, without psychosis [F33.2] 02/25/2014  . Bed bug bite [T14.8, W57.XXXA] 02/23/2014   Total Time spent with patient: 25 minutes    Past Medical History:  Past Medical History  Diagnosis Date  .  Angina   . Pneumonia ~ 2003  . Shortness of breath on exertion 05/07/11    "all the time"    Past Surgical History  Procedure Laterality Date  . Salpingoophorectomy  1996  . Cesarean section  1995  . Appendectomy  09/1999  . Colon surgery     Family History:  Family History  Problem Relation Age of Onset  . Diabetes Mother    Social History:  History  Alcohol Use No    Comment: "been clean and sober since 10/16/2007"     History  Drug Use No    Comment: "been clean and sober since 10/16/2007"    History   Social History  . Marital Status: Single    Spouse Name: N/A  . Number of Children: N/A  . Years of Education: N/A   Social History Main Topics  . Smoking status: Current Every Day Smoker -- 1.00 packs/day for 35 years    Types: Cigarettes  . Smokeless tobacco: Never Used  . Alcohol Use: No     Comment: "been clean and sober since 10/16/2007"  . Drug Use: No     Comment: "been clean and sober since 10/16/2007"  . Sexual Activity: Yes    Birth Control/ Protection: Surgical   Other Topics Concern  . None   Social History Narrative   Additional History:    Sleep:  Improved   Appetite:  Fair    Musculoskeletal: Strength & Muscle Tone: within normal limits Gait & Station: normal Patient leans: N/A   Psychiatric Specialty Exam: Physical Exam  ROS at this time does not endorse headache, chest pain, shortness of breath, abdominal pain  or distress.   Blood pressure 104/61, pulse 84, temperature 98.1 F (36.7 C), temperature source Oral, resp. rate 20, height  (1.702 m), weight 275 lb (124.739 kg), SpO2 100 %.Body mass index is 43.06 kg/(m^2).  General Appearance: Fairly Groomed  Patent attorney::  Fair  Speech:  Clear and Coherent  Volume:  Decreased  Mood:  Depressed  Affect:  Constricted but smiles briefly at times.  Thought Process:  Goal Directed  Orientation:  Other:  fully alert and attentive  Thought Content:  Rumination, about recent relapse. No  psychotic symptoms.   Suicidal Thoughts:  Yes.  without intent/plan-  Denies any plan or intention of hurting self and contracts for safety on unit  Homicidal Thoughts:  No- denies any thoughts of wanting to hurt anyone else   Memory:  Immediate;   Fair Recent;   Fair Remote;   Fair  Judgement:  Impaired  Insight:  Fair  Psychomotor Activity:  Normal  Concentration:  Fair  Recall:  [employer] of Knowledge:Fair  Language: Fair  Akathisia:  No  Handed:  Right  AIMS (if indicated):     Assets:  Communication Skills Desire for Improvement  ADL's:  Intact  Cognition: WNL  Sleep:  Number of Hours: 6.75     Current Medications: Current Facility-Administered Medications  Medication Dose Route Frequency Provider Last Rate Last Dose  . acetaminophen (TYLENOL) tablet 650 mg  650 mg Oral Q6H PRN Beau Fanny, FNP      . alum & mag hydroxide-simeth (MAALOX/MYLANTA) 200-200-20 MG/5ML suspension 30 mL  30 mL Oral Q4H PRN Beau Fanny, FNP   30 mL at 02/24/14 0839  . diphenhydrAMINE (BENADRYL) capsule 25 mg  25 mg Oral 4 times per day Court Joy, PA-C   25 mg at 02/26/14 1610  . hydrocortisone cream 1 %   Topical PRN Court Joy, PA-C      . hydrOXYzine (ATARAX/VISTARIL) tablet 25 mg  25 mg Oral Q6H PRN Beau Fanny, FNP   25 mg at 02/25/14 0831  . LORazepam (ATIVAN) tablet 1 mg  1 mg Oral Q6H PRN Jomarie Longs, MD   1 mg at 02/26/14 0751  . magnesium hydroxide (MILK OF MAGNESIA) suspension 30 mL  30 mL Oral Daily PRN Beau Fanny, FNP      . sertraline (ZOLOFT) tablet 100 mg  100 mg Oral Daily Jomarie Longs, MD   100 mg at 02/26/14 0750  . simvastatin (ZOCOR) tablet 10 mg  10 mg Oral q1800 Craige Cotta, MD   10 mg at 02/25/14 1707  . traZODone (DESYREL) tablet 100 mg  100 mg Oral QHS Jomarie Longs, MD   100 mg at 02/25/14 2201    Lab Results:  No results found for this or any previous visit (from the past 48 hour(s)).  Physical Findings: AIMS: Facial and Oral  Movements Muscles of Facial Expression: None, normal Lips and Perioral Area: None, normal Jaw: None, normal Tongue: None, normal,Extremity Movements Upper (arms, wrists, hands, fingers): None, normal Lower (legs, knees, ankles, toes): None, normal, Trunk Movements Neck, shoulders, hips: None, normal, Overall Severity Severity of abnormal movements (highest score from questions above): None, normal Incapacitation due to abnormal movements: None, normal Patient's awareness of abnormal movements (rate only patient's report): No Awareness, Dental Status Current problems with teeth and/or dentures?: No Does patient usually wear dentures?: No  CIWA:  CIWA-Ar Total: 2 COWS:  COWS Total Score: 0  Assessment:  Patient is  partially improved since admission, but remains depressed, sad , constricted in affect.  Some passive SI, but no active SI and contracts for safety on unit.. Some mild subjective symptoms of withdrawal, but appears calm, comfortable and vitals are stable. Tolerating medications well. *Of note, TSH slightly elevated. Will check FT3 and FT4 to follow up on this finding.      Treatment Plan Summary: Daily contact with patient to assess and evaluate symptoms and progress in treatment and Medication management  Treatment team working on disposition planning- as noted , patient interested in going to a residential setting. Zoloft now at 100 mgrs QDAY . Ativan PRNs for withdrawal symptoms as per CIWA scores.  FT3 and FT4        Medical Decision Making:  Review of Psycho-Social Stressors (1), Review or order clinical lab tests (1), Established Problem, Worsening (2), Review of Last Therapy Session (1), Review of Medication Regimen & Side Effects (2) and Review of New Medication or Change in Dosage (2)     Nehemiah Massed , MD  02/26/2014, 9:23 AM

## 2014-02-26 NOTE — Clinical Social Work Note (Signed)
[facility] who states that patient will be placed on wait list and that she will keep CSW updated on bed availability.   Samuella BruinKristin Khalil Szczepanik, MSW, [employer]CSWA Clinical Social Worker [facility] [phone]

## 2014-02-26 NOTE — Clinical Social Work Note (Signed)
CSW left [facility]ic[facility] and [facility] coordinators regarding referrals. Awaiting return calls.  Samuella BruinKristin Izyan Ezzell, MSW, [employer]CSWA Clinical Social Worker [facility] [phone]

## 2014-02-26 NOTE — Progress Notes (Signed)
D: Pt presents with flat affect and depressed mood. Pt stated that she feels anxious and increasingly depressed today. Pt stated that she is worried about what will happen to her once she is discharged. Pt reported that she had difficulty sleeping last night and feels tired this morning. Pt requested ativan for anxiety. Pt reported having withdrawal symptoms of sweats and tremors. Pt has minimal interaction and has been in bed this morning. Pt did not attend morning goals group with nurses.  A: Medications administered as ordered per MD. PRN med given to pt as requested. Verbal support given. Pt encouraged to attend groups. 15 minute checks performed for safety.  R: Pt worried about not being ready to discharge home and voiced concerns about d/c plans.

## 2014-02-26 NOTE — [facility] Group Notes (Signed)
Adult Psychoeducational Group Note  Date:  02/26/2014 Time:  10:32 PM  Group Topic/Focus:  Wrap-Up Group:   The focus of this group is to help patients review their daily goal of treatment and discuss progress on daily workbooks.  Participation Level:  Did Not Attend  Participation Quality:  None  Affect:  None  Cognitive:  None  Insight: None  Engagement in Group:  None  Modes of Intervention:  Discussion  Additional Comments:  Maralyn SagoSarah did not attend group.  Caroll RancherLindsay, Ophia Shamoon A 02/26/2014, 10:32 PM

## 2014-02-26 NOTE — [facility] Group Notes (Signed)
[facility] LCSW Group Therapy 02/26/2014 1:15 PM Type of Therapy: Group Therapy Participation Level: Active  Participation Quality: Attentive, Sharing and Supportive  Affect: Depressed and Flat  Cognitive: Alert and Oriented  Insight: Developing/Improving and Engaged  Engagement in Therapy: Developing/Improving and Engaged  Modes of Intervention: Activity, Clarification, Confrontation, Discussion, Education, Exploration, Limit-setting, Orientation, Problem-solving, Rapport Building, Dance movement psychotherapisteality Testing, Socialization and Support  Summary of Progress/Problems: Patient was attentive and engaged with speaker from [employer]. Patient was attentive to speaker while they shared their story of dealing with mental health and overcoming it. Patient expressed interest in their programs and services and received information on their agency. Patient processed ways they can relate to the speaker. Patient left toward the end of group- reports that she had to use the restroom- but did not return.   Samuella BruinKristin Rylynne Schicker, MSW, [employer]CSWA Clinical Social Worker [facility] [phone]

## 2014-02-26 NOTE — Plan of Care (Signed)
Problem: Alteration in mood & ability to function due to Goal: STG-Patient will attend groups Outcome: Not Met (add Reason) Pt did not attend evening wrap up group

## 2014-02-26 NOTE — Tx Team (Signed)
Interdisciplinary Treatment Plan Update (Adult) Date: 02/26/2014   Time Reviewed: 9:30 AM  Progress in Treatment: Attending groups: No Participating in groups: No Taking medication as prescribed: Yes Tolerating medication: Yes Family/Significant other contact made: No, patient has declined for CSW to make collateral contact Patient understands diagnosis: Yes Discussing patient identified problems/goals with staff: Yes Medical problems stabilized or resolved: Yes Denies suicidal/homicidal ideation: Yes Issues/concerns per patient self-inventory: Yes Other:  New problem(s) identified: N/A  Discharge Plan or Barriers:   2/16: Patient is requesting residential treatment at discharge and is agreeable to CSW making [facility] and [facility].  Reason for Continuation of Hospitalization:  Depression Anxiety Medication Stabilization   Comments: N/A  Estimated length of stay: 2-3 days  For review of initial/current patient goals, please see plan of care.  Patient is a [age] year old African American female admitted for detox from crack and alcohol and SI. [place]. Patient will benefit from crisis stabilization, medication evaluation, group therapy, and psycho education in addition to case management for discharge planning. Patient and CSW reviewed pt's identified goals and treatment plan. Pt verbalized understanding and agreed to treatment plan.   Attendees:  Patient:    SignatureSallyanne Havers: F. Cobos, MD 02/26/2014 9:30 AM   Signature: Geoffery LyonsIrving Lugo, MD 02/26/2014 9:30 AM   Signature:Ronecia Bernie CoveyByrd, Beverly Knight, [place] White, [place]ney Guthrie RN 02/26/2014 9:30 AM   Signature: 02/26/2014 9:30 AM   Signature: Herbert SetaHeather Smart, LCSWA 02/26/2014 9:30 AM   Signature: Juline PatchQuylle Hodnett, LCSW 02/26/2014 9:30 AM   Signature: Samuella BruinKristin Cem Kosman, LCSWA 02/26/2014 9:30 AM   Signature: Leisa LenzValerie Enoch, Care Coordinator [facility] 02/26/2014 9:30 AM    Signature:    Signature:      Scribe for Treatment Team:  Samuella BruinKristin Cannon Quinton, MSW, LCSWA [phone]

## 2014-02-26 NOTE — Progress Notes (Signed)
Adult Psychoeducational Group Note  Date:  02/26/2014 Time:  0900  Group Topic/Focus:  Goals Group:   The focus of this group is to help patients establish daily goals to achieve during treatment and discuss how the patient can incorporate goal setting into their daily lives to aide in recovery.  Participation Level:  Did Not Attend  Participation Quality:    Affect:    Cognitive:    Insight:   Engagement in Group:    Modes of Intervention:    Additional Comments:   Siddh Vandeventer L 02/26/2014, 9:33 AM

## 2014-02-27 LAB — T4, FREE: Free T4: 0.88 ng/dL (ref 0.80–1.80)

## 2014-02-27 MED ORDER — ARIPIPRAZOLE 2 MG PO TABS
2.0000 mg | ORAL_TABLET | Freq: Every day | ORAL | Status: DC
Start: 2014-02-28 — End: 2014-03-01
  Administered 2014-02-28 – 2014-03-01 (×2): 2 mg via ORAL
  Filled 2014-02-27 (×2): qty 14
  Filled 2014-02-27 (×4): qty 1

## 2014-02-27 MED ORDER — SERTRALINE HCL 50 MG PO TABS
150.0000 mg | ORAL_TABLET | Freq: Every day | ORAL | Status: DC
  Administered 2014-02-28 – 2014-03-01 (×2): 150 mg via ORAL
  Filled 2014-02-27 (×5): qty 1

## 2014-02-27 NOTE — Clinical Social Work Note (Signed)
[facility] Note  02/27/2014  8:45 AM  Participation Quality: Did Not Attend. Patient invited to participate but declined.  Samuella BruinKristin Millard Bautch, MSW, [employer]CSWA Clinical Social Worker [facility] [phone]

## 2014-02-27 NOTE — [facility] Group Notes (Signed)
[facility] LCSW Group Therapy 02/27/2014  1:15 PM   Type of Therapy: Group Therapy  Participation Level: Did Not Attend. Patient in bed, invited to participate but declined.   Samuella BruinKristin Leul Narramore, MSW, [employer]CSWA Clinical Social Worker [facility] [phone]

## 2014-02-27 NOTE — Clinical Social Work Note (Signed)
CSW [facility], awaiting return call.  Samuella BruinKristin Evyn Putzier, MSW, [employer]CSWA Clinical Social Worker [facility] [phone]

## 2014-02-27 NOTE — Progress Notes (Signed)
Patient ID: Connie Rogers, female   DOB: November 01, 1960, 54 y.o.   MRN: [id number] [facility] MD Progress Note  02/27/2014 4:46 PM Connie Rogers  MRN:  [id number] Subjective:  She states she remains depressed. Describes poor energy and motivation. Denies medications side effects.  Objective; Patient seen and chart reviewed.Pt discussed with treatment team.  Patient's group, milieu participation has been limited. She tends to spend a lot of time in her room/bed. Patient states " I wil try to make more of the groups, the problem is my motivation is low", which she attributes to depression. She states she is very anxious about  Her current homelessness, and where she will be going after discharge. She has expressed interest in going to a Rehab setting and SW is looking into options . She denies medication side effects. Denies any current suicidal plan or intention. No disruptive behaviors on unit.    Principal Problem: MDD (major depressive disorder), recurrent severe, without psychosis Diagnosis:   Patient Active Problem List   Diagnosis Date Noted  . Alcohol use disorder, severe, dependence [F10.20] 02/25/2014  . Cocaine use disorder, moderate, dependence [F14.20] 02/25/2014  . MDD (major depressive disorder), recurrent severe, without psychosis [F33.2] 02/25/2014  . Bed bug bite [T14.8, W57.XXXA] 02/23/2014   Total Time spent with patient: 20 minutes   Past Medical History:  Past Medical History  Diagnosis Date  . Angina   . Pneumonia ~ 2003  . Shortness of breath on exertion 05/07/11    "all the time"    Past Surgical History  Procedure Laterality Date  . Salpingoophorectomy  1996  . Cesarean section  1995  . Appendectomy  09/1999  . Colon surgery     Family History:  Family History  Problem Relation Age of Onset  . Diabetes Mother    Social History:  History  Alcohol Use No    Comment: "been clean and sober since 10/16/2007"     History  Drug Use No    Comment:  "been clean and sober since 10/16/2007"    History   Social History  . Marital Status: Single    Spouse Name: N/A  . Number of Children: N/A  . Years of Education: N/A   Social History Main Topics  . Smoking status: Current Every Day Smoker -- 1.00 packs/day for 35 years    Types: Cigarettes  . Smokeless tobacco: Never Used  . Alcohol Use: No     Comment: "been clean and sober since 10/16/2007"  . Drug Use: No     Comment: "been clean and sober since 10/16/2007"  . Sexual Activity: Yes    Birth Control/ Protection: Surgical   Other Topics Concern  . None   Social History Narrative   Additional History:    Sleep:  Improved   Appetite: improved     Musculoskeletal: Strength & Muscle Tone: within normal limits Gait & Station: normal Patient leans: N/A   Psychiatric Specialty Exam: Physical Exam  ROS at this time does not endorse headache, chest pain, shortness of breath, abdominal pain or distress.   Blood pressure 109/59, pulse 98, temperature 97.9 F (36.6 C), temperature source Oral, resp. rate 16, height  (1.702 m), weight 275 lb (124.739 kg), SpO2 100 %.Body mass index is 43.06 kg/(m^2).  General Appearance: Fairly Groomed  Patent attorney::  improving  Speech:  Clear and Coherent  Volume:  Decreased  Mood:  Depressed  Affect:  Constricted   Thought Process:  Goal Directed  Orientation:  Other:  fully alert and attentive  Thought Content:  Rumination, about recent relapse. No  Hallucinations, no delusions, does not appear internally preoccupied    Suicidal Thoughts:  No-  Denies any plan or intention of hurting self and contracts for safety on unit  Homicidal Thoughts:  No- denies any thoughts of wanting to hurt anyone else   Memory:  Immediate;   Fair Recent;   Fair Remote;   Fair  Judgement:  Fair  Insight:  Fair  Psychomotor Activity:  Decreased  Concentration:  Fair  Recall:  [employer]Fair  Fund of Knowledge:Fair  Language: Fair  Akathisia:  No  Handed:   Right  AIMS (if indicated):     Assets:  Communication Skills Desire for Improvement  ADL's:  Intact  Cognition: WNL  Sleep:  Number of Hours: 6.75     Current Medications: Current Facility-Administered Medications  Medication Dose Route Frequency Provider Last Rate Last Dose  . acetaminophen (TYLENOL) tablet 650 mg  650 mg Oral Q6H PRN Beau FannyJohn C Withrow, FNP      . alum & mag hydroxide-simeth (MAALOX/MYLANTA) 200-200-20 MG/5ML suspension 30 mL  30 mL Oral Q4H PRN Beau FannyJohn C Withrow, FNP   30 mL at 02/24/14 0839  . diphenhydrAMINE (BENADRYL) capsule 25 mg  25 mg Oral 4 times per day Court Joyharles E Kober, PA-C   25 mg at 02/27/14 1208  . hydrocortisone cream 1 %   Topical PRN Court Joyharles E Kober, PA-C      . hydrOXYzine (ATARAX/VISTARIL) tablet 25 mg  25 mg Oral Q6H PRN Beau FannyJohn C Withrow, FNP   25 mg at 02/25/14 0831  . LORazepam (ATIVAN) tablet 1 mg  1 mg Oral Q6H PRN Jomarie LongsSaramma Eappen, MD   1 mg at 02/26/14 0751  . magnesium hydroxide (MILK OF MAGNESIA) suspension 30 mL  30 mL Oral Daily PRN Beau FannyJohn C Withrow, FNP      . sertraline (ZOLOFT) tablet 100 mg  100 mg Oral Daily Jomarie LongsSaramma Eappen, MD   100 mg at 02/27/14 0741  . simvastatin (ZOCOR) tablet 10 mg  10 mg Oral q1800 Craige CottaFernando A Cobos, MD   10 mg at 02/26/14 1708  . traZODone (DESYREL) tablet 100 mg  100 mg Oral QHS Jomarie LongsSaramma Eappen, MD   100 mg at 02/26/14 2136    Lab Results:  Results for orders placed or performed during the hospital encounter of 02/23/14 (from the past 48 hour(s))  TSH     Status: Abnormal   Collection Time: 02/26/14  6:26 AM  Result Value Ref Range   TSH 5.326 (H) 0.350 - 4.500 uIU/mL    Comment: [facility]  T4, free     Status: None   Collection Time: 02/27/14  6:45 AM  Result Value Ref Range   Free T4 0.88 0.80 - 1.80 ng/dL    Comment: Performed at [facility]    Physical Findings: AIMS: Facial and Oral Movements Muscles of Facial Expression: None, normal Lips and Perioral Area: None,  normal Jaw: None, normal Tongue: None, normal,Extremity Movements Upper (arms, wrists, hands, fingers): None, normal Lower (legs, knees, ankles, toes): None, normal, Trunk Movements Neck, shoulders, hips: None, normal, Overall Severity Severity of abnormal movements (highest score from questions above): None, normal Incapacitation due to abnormal movements: None, normal Patient's awareness of abnormal movements (rate only patient's report): No Awareness, Dental Status Current problems with teeth and/or dentures?: No Does patient usually wear dentures?: No  CIWA:  CIWA-Ar Total: 0  COWS:  COWS Total Score: 0  Assessment:  Patient remains depressed, sad, and reports poor energy, low motivation. She attributes this partially to being homeless at this time and being stressed about where she will be going after discharge. She is not suicidal , and contracts for safety on the unit at this time. She is tolerating medications well. TSH mildly elevated and thyroid hormone on lower range of normal. This could also be contributing to depression, low energy.     Treatment Plan Summary: Daily contact with patient to assess and evaluate symptoms and progress in treatment and Medication management  Treatment team working on disposition planning- as noted , patient interested in going to a residential setting. Increase Zoloft to 150 mgrs QDAY. Add Abilify 2 mgrs QDAY as augmentation strategy. Will request hospitalist consult to address possibly starting thyroid medication, if indicated          Medical Decision Making:  Review of Psycho-Social Stressors (1), Review or order clinical lab tests (1), Established Problem, Worsening (2), Review of Last Therapy Session (1), Review of Medication Regimen & Side Effects (2) and Review of New Medication or Change in Dosage (2)     Nehemiah Massed , MD  02/27/2014, 4:46 PM

## 2014-02-27 NOTE — [facility] Group Notes (Signed)
Adult Psychoeducational Group Note  Date:  02/27/2014 Time:  9:15 PM  Group Topic/Focus:  Wrap-Up Group:   The focus of this group is to help patients review their daily goal of treatment and discuss progress on daily workbooks.  Participation Level:  Did Not Attend  Participation Quality:  None  Affect:  None  Cognitive:  None  Insight: None  Engagement in Group:  None  Modes of Intervention:  Discussion  Additional Comments:  Maralyn SagoSarah did not attend group.  Caroll RancherLindsay, Klaudia Beirne A 02/27/2014, 9:15 PM

## 2014-02-27 NOTE — Progress Notes (Signed)
D: Pt presents with flat affect and depressed mood. Pt denies suicidal thoughts. Pt denies AVH. Pt compliant with taking meds. Pt has minimal interaction on the unit and is noncompliant with attending groups. Pt isolates in her room throughout the day. Pt reports feeling depressed d/t not knowing what's going to happen once she is discharged.  A: Medications administered as ordered per MD. Verbal support given. Pt encouraged to attend groups. Pt encouraged to report worsening symptoms.  15 minute checks performed for safety.  R: Pt safety maintained at this time.

## 2014-02-27 NOTE — [facility] Group Notes (Signed)
[facility] LCSW Aftercare Discharge Planning Group Note  02/27/2014  8:45 AM  Participation Quality: Did Not Attend. Patient invited to participate but declined.  Alaiya Martindelcampo, MSW, LCSWA Clinical Social Worker [facility] [phone]   

## 2014-02-27 NOTE — Progress Notes (Signed)
D. Pt had been in room and for much of the evening, pt did not attend or participate in evening group session. Pt does appear depressed and withdrawn and had minimal interaction with staff this evening. Pt did receive medications this evening without incident and only complained of itching but did report that the benadryl was helpful for her. A. Support and encouragement provided. R. Safety maintained, will continue to monitor.

## 2014-02-28 LAB — T3, FREE: T3 FREE: 2.8 pg/mL (ref 2.0–4.4)

## 2014-02-28 NOTE — Clinical Social Work Note (Signed)
CSW met with patient to discuss discharge plans. Patient informed that referral to [facility] has been made and patient is on wait list, and that CSW has yet to hear back from [place] [facility]. CSW explored alternative options with patient who states that she does not want to live with her sister at discharge due to her sister living in a drug neighborhood. Patient initially declined [facility] due to owing them money from a previous stay but did take listing and stated that she would contact houses in [place] area.   Tilden Fossa, MSW, [place] Worker [facility] [phone]

## 2014-02-28 NOTE — Progress Notes (Signed)
D. Pt had been up and visible in milieu this evening, did not attend group activity. Pt has appeared depressed on the unit and has had minimal interaction with staff or fellow peers. Pt spoke about looking for a place to go when she gets discharged. Pt did speak briefly about how she has little motivation and reports that while she may be in bed she is not sleeping but rather just lying there absorbed in her current situation. Pt did receive medications without incident. A. Support and encouragement provided. R. Safety maintained, will continue to monitor.

## 2014-02-28 NOTE — [facility] Group Notes (Signed)
[facility] LCSW Group Therapy 02/28/2014  1:15 PM   Type of Therapy: Group Therapy  Participation Level: Did Not Attend. Patient in bed, invited to attend but declined.   Connie BruinKristin Lam Rogers, MSW, [employer] Clinical Social Worker [facility] [phone]

## 2014-02-28 NOTE — Clinical Social Work Note (Signed)
Per patient request, CSW left voicemail for patient's friend Elmarie Shileyiffany [phone]. Awaiting return call.  Samuella BruinKristin Espiridion Supinski, MSW, [employer]CS[employer] Clinical Social Worker [facility] [phone]

## 2014-02-28 NOTE — Progress Notes (Signed)
Patient ID: Connie Rogers, female   DOB: 10-27-1960, 54 y.o.   MRN: [id number] [facility] MD Progress Note  02/28/2014 4:21 PM Connie Rogers  MRN:  [id number] Subjective:   Patient states she is feeling " just a little bit better today". She reports symptoms of having a " cold", such as rhinorrhea, nasal congestion. Denies chills, denies productive cough, and no fever.  Objective; Patient seen and chart reviewed.Pt discussed with treatment team.  Group participation remains limited, and patient's motivation in milieu has been limited . She does state she is " starting to go to more groups" as she is starting to feel somewhat better. As discussed with staff, patient may have a bed available at [facility] tomorrow. Patient has expressed interest in going to a rehab setting after discharge. At this time no disruptive behaviors on unit- she is pleasant and cooperative upon approach. Denies any  Current significant medication side effects . No ongoing symptoms of withdrawal noted or reported . Vitals are stable. FT3 and FT4  Within normal limits .   Principal Problem: MDD (major depressive disorder), recurrent severe, without psychosis Diagnosis:   Patient Active Problem List   Diagnosis Date Noted  . Alcohol use disorder, severe, dependence [F10.20] 02/25/2014  . Cocaine use disorder, moderate, dependence [F14.20] 02/25/2014  . MDD (major depressive disorder), recurrent severe, without psychosis [F33.2] 02/25/2014  . Bed bug bite [T14.8, W57.XXXA] 02/23/2014   Total Time spent with patient: 20 minutes   Past Medical History:  Past Medical History  Diagnosis Date  . Angina   . Pneumonia ~ 2003  . Shortness of breath on exertion 05/07/11    "all the time"    Past Surgical History  Procedure Laterality Date  . Salpingoophorectomy  1996  . Cesarean section  1995  . Appendectomy  09/1999  . Colon surgery     Family History:  Family History  Problem Relation Age of Onset  . Diabetes  Mother    Social History:  History  Alcohol Use No    Comment: "been clean and sober since 10/16/2007"     History  Drug Use No    Comment: "been clean and sober since 10/16/2007"    History   Social History  . Marital Status: Single    Spouse Name: N/A  . Number of Children: N/A  . Years of Education: N/A   Social History Main Topics  . Smoking status: Current Every Day Smoker -- 1.00 packs/day for 35 years    Types: Cigarettes  . Smokeless tobacco: Never Used  . Alcohol Use: No     Comment: "been clean and sober since 10/16/2007"  . Drug Use: No     Comment: "been clean and sober since 10/16/2007"  . Sexual Activity: Yes    Birth Control/ Protection: Surgical   Other Topics Concern  . None   Social History Narrative   Additional History:    Sleep:  Improved   Appetite: improved     Musculoskeletal: Strength & Muscle Tone: within normal limits Gait & Station: normal Patient leans: N/A   Psychiatric Specialty Exam: Physical Exam  ROS at this time does not endorse headache, chest pain, shortness of breath, abdominal pain or distress.  (+) nasal congestion, rhinorrhea as described above .  Blood pressure 118/62, pulse 97, temperature 97.7 F (36.5 C), temperature source Oral, resp. rate 18, height  (1.702 m), weight 275 lb (124.739 kg), SpO2 100 %.Body mass index is 43.06 kg/(m^2).  General  Appearance: improved grooming  Eye Contact::  Good  Speech:  Clear and Coherent  Volume:  Normal  Mood:   Less depressed   Affect:  Constricted , but to a lesser degree- does smile at times during session  Thought Process:  Goal Directed  Orientation:  Other:  fully alert and attentive  Thought Content:   No  Hallucinations, no delusions, does not appear internally preoccupied    Suicidal Thoughts:  No-  Denies any plan or intention of hurting self and contracts for safety on unit  Homicidal Thoughts:  No- denies any thoughts of wanting to hurt anyone else   Memory:   Immediate;   Fair Recent;   Fair Remote;   Fair  Judgement:  Fair  Insight:  Fair  Psychomotor Activity:  Decreased  Concentration:  Fair  Recall:  [employer] of Knowledge:Fair  Language: Fair  Akathisia:  No  Handed:  Right  AIMS (if indicated):     Assets:  Communication Skills Desire for Improvement  ADL's:  Intact  Cognition: WNL  Sleep:  Number of Hours: 6.75     Current Medications: Current Facility-Administered Medications  Medication Dose Route Frequency Provider Last Rate Last Dose  . acetaminophen (TYLENOL) tablet 650 mg  650 mg Oral Q6H PRN Beau Fanny, FNP      . alum & mag hydroxide-simeth (MAALOX/MYLANTA) 200-200-20 MG/5ML suspension 30 mL  30 mL Oral Q4H PRN Beau Fanny, FNP   30 mL at 02/24/14 0839  . ARIPiprazole (ABILIFY) tablet 2 mg  2 mg Oral Daily Craige Cotta, MD   2 mg at 02/28/14 0840  . diphenhydrAMINE (BENADRYL) capsule 25 mg  25 mg Oral 4 times per day Court Joy, PA-C   25 mg at 02/28/14 1159  . hydrocortisone cream 1 %   Topical PRN Court Joy, PA-C      . hydrOXYzine (ATARAX/VISTARIL) tablet 25 mg  25 mg Oral Q6H PRN Beau Fanny, FNP   25 mg at 02/25/14 0831  . LORazepam (ATIVAN) tablet 1 mg  1 mg Oral Q6H PRN Jomarie Longs, MD   1 mg at 02/27/14 1832  . magnesium hydroxide (MILK OF MAGNESIA) suspension 30 mL  30 mL Oral Daily PRN Beau Fanny, FNP      . sertraline (ZOLOFT) tablet 150 mg  150 mg Oral Daily Craige Cotta, MD   150 mg at 02/28/14 0840  . simvastatin (ZOCOR) tablet 10 mg  10 mg Oral q1800 Craige Cotta, MD   10 mg at 02/27/14 1716  . traZODone (DESYREL) tablet 100 mg  100 mg Oral QHS Jomarie Longs, MD   100 mg at 02/27/14 2124    Lab Results:  Results for orders placed or performed during the hospital encounter of 02/23/14 (from the past 48 hour(s))  T3, free     Status: None   Collection Time: 02/27/14  6:45 AM  Result Value Ref Range   T3, Free 2.8 2.0 - 4.4 pg/mL    Comment: (NOTE) Performed  At: [facility] [address] [place], [place] [id number] Mila Homer MD ZH:[id number] Performed at [facility]   T4, free     Status: None   Collection Time: 02/27/14  6:45 AM  Result Value Ref Range   Free T4 0.88 0.80 - 1.80 ng/dL    Comment: Performed at [facility]    Physical Findings: AIMS: Facial and Oral Movements Muscles of Facial  Expression: None, normal Lips and Perioral Area: None, normal Jaw: None, normal Tongue: None, normal,Extremity Movements Upper (arms, wrists, hands, fingers): None, normal Lower (legs, knees, ankles, toes): None, normal, Trunk Movements Neck, shoulders, hips: None, normal, Overall Severity Severity of abnormal movements (highest score from questions above): None, normal Incapacitation due to abnormal movements: None, normal Patient's awareness of abnormal movements (rate only patient's report): No Awareness, Dental Status Current problems with teeth and/or dentures?: No Does patient usually wear dentures?: No  CIWA:  CIWA-Ar Total: 2 COWS:  COWS Total Score: 0  Assessment:  At this time patient is feeling  better, and does present with partially improved range of affect, improved eye contact, and less isolation. Motivation in milieu, groups has  Been limited. Tolerating medications well at this time.  Interested in going to a Rehab after discharge, such as [facility].    Treatment Plan Summary: Daily contact with patient to assess and evaluate symptoms and progress in treatment and Medication management  Treatment team working on disposition planning- as noted , patient interested in going to a residential setting. Zoloft  150 mgrs QDAY. Abilify 2 mgrs QDAY           Medical Decision Making:  Review of Psycho-Social Stressors (1), Review or order clinical lab tests (1), Established Problem, Worsening (2), Review of Last Therapy Session (1) and Review of Medication Regimen & Side Effects  (2)     Nehemiah MassedOBOS, Siriah Treat , MD  02/28/2014, 4:21 PM

## 2014-02-28 NOTE — Clinical Social Work Note (Signed)
Patient was informed that she needs to call [facility] at this time to complete telephone screening.  Samuella BruinKristin Colan Laymon, MSW, [employer]CSWA Clinical Social Worker [facility] [phone]

## 2014-02-28 NOTE — Progress Notes (Signed)
The patient refused to attend this evening's Karaoke group.

## 2014-02-28 NOTE — Progress Notes (Signed)
The focus of this group is to educate the patient on the purpose and policies of crisis stabilization and provide a format to answer questions about their admission.  The group details unit policies and expectations of patients while admitted. Patient did not attend this group. 

## 2014-02-28 NOTE — [facility] Suicide Risk Assessment (Signed)
[facility] INPATIENT:  Family/Significant Other Suicide Prevention Education  Suicide Prevention Education:  Education Completed; friend Connie Rogers [phone],  (name of family member/significant other) has been identified by the patient as the family member/significant other with whom the patient will be residing, and identified as the person(s) who will aid the patient in the event of a mental health crisis (suicidal ideations/suicide attempt).  With written consent from the patient, the family member/significant other has been provided the following suicide prevention education, prior to the and/or following the discharge of the patient.  The suicide prevention education provided includes the following:  Suicide risk factors  Suicide prevention and interventions  National Suicide Hotline telephone number  [facility] assessment telephone number  [facility] Emergency Assistance 911  [facility] and/or Residential Mobile Crisis Unit telephone number  Request made of family/significant other to:  Remove weapons (e.g., guns, rifles, knives), all items previously/currently identified as safety concern.    Remove drugs/medications (over-the-counter, prescriptions, illicit drugs), all items previously/currently identified as a safety concern.  The family member/significant other verbalizes understanding of the suicide prevention education information provided.  The family member/significant other agrees to remove the items of safety concern listed above.  Connie Rogers, [place]ristin Rogers 02/28/2014, 12:55 PM

## 2014-02-28 NOTE — Clinical Social Work Note (Signed)
[facility] bed [facility] for tomorrow 02/28/14 at 2 pm.   Samuella BruinKristin Estera Ozier, MSW, [facility] Clinical Social Worker [facility] [phone]

## 2014-03-01 ENCOUNTER — Encounter (HOSPITAL_COMMUNITY): Payer: Self-pay | Admitting: Registered Nurse

## 2014-03-01 DIAGNOSIS — F322 Major depressive disorder, single episode, severe without psychotic features: Secondary | ICD-10-CM | POA: Insufficient documentation

## 2014-03-01 MED ORDER — SIMVASTATIN 20 MG PO TABS: 20.0000 mg | ORAL_TABLET | Freq: Every day | ORAL | Status: AC

## 2014-03-01 MED ORDER — SIMVASTATIN 20 MG PO TABS: 20.0000 mg | ORAL_TABLET | Freq: Every day | ORAL | Status: DC

## 2014-03-01 MED ORDER — HYDROXYZINE HCL 25 MG PO TABS
25.0000 mg | ORAL_TABLET | Freq: Four times a day (QID) | ORAL | Status: AC | PRN
Start: 2014-03-01 — End: ?

## 2014-03-01 MED ORDER — SERTRALINE HCL 50 MG PO TABS: 150.0000 mg | ORAL_TABLET | Freq: Every day | ORAL | Status: AC

## 2014-03-01 MED ORDER — TRAZODONE HCL 100 MG PO TABS: 100.0000 mg | ORAL_TABLET | Freq: Every day | ORAL | Status: AC

## 2014-03-01 MED ORDER — ACETAMINOPHEN 325 MG PO TABS: 650.0000 mg | ORAL_TABLET | Freq: Four times a day (QID) | ORAL | Status: DC | PRN

## 2014-03-01 MED ORDER — ARIPIPRAZOLE 2 MG PO TABS: 2.0000 mg | ORAL_TABLET | Freq: Every day | ORAL | Status: AC

## 2014-03-01 MED ORDER — SIMVASTATIN 20 MG PO TABS
20.0000 mg | ORAL_TABLET | Freq: Every day | ORAL | Status: DC
  Filled 2014-03-01: qty 14

## 2014-03-01 MED ORDER — SERTRALINE HCL 100 MG PO TABS
150.0000 mg | ORAL_TABLET | Freq: Every day | ORAL | Status: DC
  Filled 2014-03-01: qty 21

## 2014-03-01 NOTE — Progress Notes (Signed)
D. Pt had been in room and lying in bed for much of the evening, did not attend evening group activity and minimal interaction within the milieu. Pt does report feeling somewhat better, however pt does appear flat in her affect with a depressed mood and while reporting that she is feeling better is also still endorsing depression. Pt did receive medications this evening without incident and has denied any SI. A. Support and encouragement provided. R. Safety maintained, will continue to monitor.

## 2014-03-01 NOTE — Tx Team (Signed)
Interdisciplinary Treatment Plan Update (Adult) Date: 03/01/2014   Time Reviewed: 9:30 AM  Progress in Treatment: Attending groups: No Participating in groups: No Taking medication as prescribed: Yes Tolerating medication: Yes Family/Significant other contact made:Yes, CSW has spoken with friend Tiffany Patient understands diagnosis: Yes Discussing patient identified problems/goals with staff: Yes Medical problems stabilized or resolved: Yes Denies suicidal/homicidal ideation: Yes Issues/concerns per patient self-inventory: Yes Other:  New problem(s) identified: N/A  Discharge Plan or Barriers:   2/16: Patient is requesting residential treatment at discharge and is agreeable to CSW [facility] and [facility].  2/19: [facility]   Reason for Continuation of Hospitalization:  Depression Anxiety Medication Stabilization   Comments: N/A  Estimated length of stay: Discharge anticipated for today 2/19.  For review of initial/current patient goals, please see plan of care.  Patient is a 54 year old African American female admitted for detox from crack and alcohol and SI. [place]. Patient will benefit from crisis stabilization, medication evaluation, group therapy, and psycho education in addition to case management for discharge planning. Patient and CSW reviewed pt's identified goals and treatment plan. Pt verbalized understanding and agreed to treatment plan.   Attendees:  Patient:    Signature: Sallyanne HaversF. Cobos, MD 03/01/2014 9:30 AM   Signature: Geoffery LyonsIrving Lugo, MD 03/01/2014 9:30 AM   Signature:Chris Sharee PimpleJudge, RN; Shelda JakesPatty Duke, RN 03/01/2014 9:30 AM   Signature: 03/01/2014 9:30 AM   Signature: Herbert SetaHeather Smart, LCSWA 03/01/2014 9:30 AM   Signature: Juline PatchQuylle Hodnett, LCSW 03/01/2014 9:30 AM   Signature: Belenda CruiseKristin Cherril Hett, LCSWA 03/01/2014 9:30 AM   Signature: Leisa LenzValerie  Enoch, Care Coordinator [facility] 03/01/2014 9:30 AM   Signature: Onnie BoerJennifer Clark, CM   Signature:          Scribe for Treatment Team:  Samuella BruinKristin Donnetta Gillin, MSW, LCSWA [phone]

## 2014-03-01 NOTE — Discharge Summary (Signed)
Physician Discharge Summary Note  Patient:  Connie Rogers is an 54 y.o., female MRN:  [id number] DOB:  [birth year] Patient phone:  [phone] (home)  Patient address:   [address] [place] [address],  Total Time spent with patient: Greater than 30 minutes  Date of Admission:  02/23/2014 Date of Discharge: 03/01/2014  Reason for Admission:  Connie Rogers is an 54 y.o. female that was seen initially in to ED by sister after she reported SI and relapse on cocaine and alcohol after 8 years of sobriety. She has hx of relapse in November 2015. Pt reports she has been using cocaine and drinking a case of beer daily. Per patient, she experienced withdrawal sx of headache, tremor, chills, nausea. Pt stated she has hx of depression as well with hx of suicide attempt by overdose years ago. She was on Zoloft but admitted to missing doses. She reports having several stressors in live like job loss and loss of her car. She is denying SI/HI/AVH and is contracting for safety.   She was seen today and she is in bed. She has a flat affect and is somnolent. She denies any adverse reactions to medications and states she is trying to attend groups that are offered on the unit. There are no disruptive behaviors seen.   Principal Problem: MDD (major depressive disorder), recurrent severe, without psychosis Discharge Diagnoses: Patient Active Problem List   Diagnosis Date Noted  . Major depressive disorder, single episode, severe without psychotic features [F32.2]   . Alcohol use disorder, severe, dependence [F10.20] 02/25/2014  . Cocaine use disorder, moderate, dependence [F14.20] 02/25/2014  . MDD (major depressive disorder), recurrent severe, without psychosis [F33.2] 02/25/2014  . Bed bug bite [T14.8, W57.XXXA] 02/23/2014    Musculoskeletal: Strength & Muscle Tone: within normal limits Gait & Station: normal Patient leans: N/A  Psychiatric Specialty Exam:   See Suicide  Risk Assessment Physical Exam  Review of Systems  Psychiatric/Behavioral: Positive for substance abuse. Negative for suicidal ideas, hallucinations and memory loss. Depression: Stable. Nervous/anxious: Stable. Insomnia: Stable.     Blood pressure 104/71, pulse 85, temperature 98.8 F (37.1 C), temperature source Oral, resp. rate 18, height  (1.702 m), weight 124.739 kg (275 lb), SpO2 100 %.Body mass index is 43.06 kg/(m^2).  Past Medical History:  Past Medical History  Diagnosis Date  . Angina   . Pneumonia ~ 2003  . Shortness of breath on exertion 05/07/11    "all the time"    Past Surgical History  Procedure Laterality Date  . Salpingoophorectomy  1996  . Cesarean section  1995  . Appendectomy  09/1999  . Colon surgery     Family History:  Family History  Problem Relation Age of Onset  . Diabetes Mother    Social History:  History  Alcohol Use No    Comment: "been clean and sober since 10/16/2007"     History  Drug Use No    Comment: "been clean and sober since 10/16/2007"    History   Social History  . Marital Status: Single    Spouse Name: N/A  . Number of Children: N/A  . Years of Education: N/A   Social History Main Topics  . Smoking status: Current Every Day Smoker -- 1.00 packs/day for 35 years    Types: Cigarettes  . Smokeless tobacco: Never Used  . Alcohol Use: No     Comment: "been clean and sober since 10/16/2007"  . Drug Use: No  Comment: "been clean and sober since 10/16/2007"  . Sexual Activity: Yes    Birth Control/ Protection: Surgical   Other Topics Concern  . None   Social History Narrative   Risk to Self: Is patient at risk for suicide?: Yes What has been your use of drugs/alcohol within the last 12 months?: Relapse in Nov. 2015 after 8 years of sobriety. Daily crack ($100/day) and alcohol use (12 pack/day) Risk to Others:   Prior Inpatient Therapy:   Prior Outpatient Therapy:    [facility]  Hospital Course:  Connie ChristiansSarah L  Rogers was admitted for MDD (major depressive disorder), recurrent severe, without psychosis and crisis management.  She was treated discharged with the medications listed below under Medication List.  Medical problems were identified and treated as needed.  Home medications were restarted as appropriate.  Improvement was monitored by observation and Connie ChristiansSarah L Rogers daily report of symptom reduction.  Emotional and mental status was monitored by daily self-inventory reports completed by Connie Rogers and clinical staff.         Connie Rogers was evaluated by the treatment team for stability and plans for continued recovery upon discharge.  Connie Rogers motivation was an integral factor for scheduling further treatment.  Employment, transportation, bed availability, health status, family support, and any pending legal issues were also considered during her hospital stay.  She was offered further treatment options upon discharge including but not limited to Residential, Intensive Outpatient, and Outpatient treatment.  Connie Rogers will follow up with the services as listed below under Follow Up Information.     Upon completion of this admission the patient was both mentally and medically stable for discharge denying suicidal/homicidal ideation, auditory/visual/tactile hallucinations, delusional thoughts and paranoia.      Consults:  psychiatry  Significant Diagnostic Studies:  labs: T3,free, CBC, CMET, TSH, ETOH, UDS, Urinalysis  Discharge Vitals:   Blood pressure 104/71, pulse 85, temperature 98.8 F (37.1 C), temperature source Oral, resp. rate 18, height 5\' 7"  (1.702 m), weight 124.739 kg (275 lb), SpO2 100 %. Body mass index is 43.06 kg/(m^2). Lab Results:   Results for orders placed or performed during the hospital encounter of 02/23/14 (from the past 72 hour(s))  T3, free     Status: None   Collection Time: 02/27/14  6:45 AM  Result Value Ref Range   T3,  Free 2.8 2.0 - 4.4 pg/mL    Comment: (NOTE) Performed At: [facility] [address] [place], [place] [id number] Mila HomerHancock William F MD MW:[phone] Performed at [facility]   T4, free     Status: None   Collection Time: 02/27/14  6:45 AM  Result Value Ref Range   Free T4 0.88 0.80 - 1.80 ng/dL    Comment: Performed at [facility]    Physical Findings: AIMS: Facial and Oral Movements Muscles of Facial Expression: None, normal Lips and Perioral Area: None, normal Jaw: None, normal Tongue: None, normal,Extremity Movements Upper (arms, wrists, hands, fingers): None, normal Lower (legs, knees, ankles, toes): None, normal, Trunk Movements Neck, shoulders, hips: None, normal, Overall Severity Severity of abnormal movements (highest score from questions above): None, normal Incapacitation due to abnormal movements: None, normal Patient's awareness of abnormal movements (rate only patient's report): No Awareness, Dental Status Current problems with teeth and/or dentures?: No Does patient usually wear dentures?: No  CIWA:  CIWA-Ar Total: 2 COWS:  COWS Total Score: 0   See Psychiatric Specialty Exam  and Suicide Risk Assessment completed by Attending Physician prior to discharge.  Discharge destination:  [facility]  Is patient on multiple antipsychotic therapies at discharge:  No   Has Patient had three or more failed trials of antipsychotic monotherapy by history:  No    Recommended Plan for Multiple Antipsychotic Therapies: NA     Medication List    TAKE these medications      Indication   ARIPiprazole 2 MG tablet  Commonly known as:  ABILIFY  Take 1 tablet (2 mg total) by mouth daily.   Indication:  Mood Stabilization     hydrOXYzine 25 MG tablet  Commonly known as:  ATARAX/VISTARIL  Take 1 tablet (25 mg total) by mouth every 6 (six) hours as needed for anxiety.   Indication:  Anxiety Neurosis     sertraline 50 MG tablet  Commonly  known as:  ZOLOFT  Take 3 tablets (150 mg total) by mouth daily.   Indication:  Major Depressive Disorder     simvastatin 20 MG tablet  Commonly known as:  ZOCOR  Take 1 tablet (20 mg total) by mouth at bedtime.   Indication:  high cholesterol     traZODone 100 MG tablet  Commonly known as:  DESYREL  Take 1 tablet (100 mg total) by mouth at bedtime.   Indication:  Trouble Sleeping           Follow-up Information    Follow up with [facility] On 03/01/2014.   Why:  Please present to [facility] today for continued treatment. Please call office if you need to reschedule.   Contact information:   [address]  [place], [place] [address] [phone]      Follow-up recommendations:  Activity:  As tolerated Diet:  As tolerated  Comments:   Patient has been instructed to take medications as prescribed; and report adverse effects to outpatient provider.  Follow up with primary doctor for any medical issues and If symptoms recur report to nearest emergency or crisis hot line.    Total Discharge Time: Greater than 30 minutes  Signed: Assunta Found, FNP-BC 03/01/2014, 2:02 PM   Patient seen, Suicide Assessment Completed.  Disposition Plan Reviewed

## 2014-03-01 NOTE — Progress Notes (Signed)
D) [facility] and is being transported there by the staff of [facility]. Affect and mood are appropriate. Pt denies SI and HI, Delusions or hallucinations.  A) Pt given support, reassurance and praise. Education given on Pt's medications and follow up plans. Provided with a 1:1. Encouragement given. All belongings returned to Pt. R) Pt grateful to be going to a long term care.

## 2014-03-01 NOTE — [facility] Group Notes (Signed)
[facility] LCSW Aftercare Discharge Planning Group Note  03/01/2014  8:45 AM  Participation Quality: Did Not Attend. Patient invited to participate but declined.  Cary Lothrop, MSW, LCSWA Clinical Social Worker [facility] [phone]    

## 2014-03-01 NOTE — Progress Notes (Signed)
  [facility] :  Will you be returning to the same living situation after discharge:  No. [facility]. At discharge, do you have transportation home?: Yes,  [facility] will provide transportation Do you have the ability to pay for your medications: Yes,  patient will be provided with medication samples and prescriptions at discharge  Release of information consent forms completed and in the chart;  Patient's signature needed at discharge.  Patient to Follow up at: Follow-up Information    Follow up with [facility] On 03/01/2014.   Why:  [facility]. Please call office if you need to reschedule.   Contact information:   [address],  [place], [place] [address] [phone]      Patient denies SI/HI: Yes,  denies    Safety Planning and Suicide Prevention discussed: Yes,  with patient and friend  Have you used any form of tobacco in the last 30 days? (Cigarettes, Smokeless Tobacco, Cigars, and/or Pipes): Yes  Has patient been referred to the Quitline?: Patient refused referral  Connie Rogers, [place] CarboKristin L 03/01/2014, 10:18 AM

## 2014-03-01 NOTE — [facility] Suicide Risk Assessment (Signed)
[facility] Discharge Suicide Risk Assessment   Demographic Factors:  54 year old female, separated, has one adult child, currently homeless   Total Time spent with patient: 30 minutes  Musculoskeletal: Strength & Muscle Tone: within normal limits Gait & Station: normal Patient leans: N/A  Psychiatric Specialty Exam: Physical Exam  ROS  Blood pressure 104/71, pulse 85, temperature 98.8 F (37.1 C), temperature source Oral, resp. rate 18, height  (1.702 m), weight 275 lb (124.739 kg), SpO2 100 %.Body mass index is 43.06 kg/(m^2).  General Appearance: Fairly Groomed  Patent attorney::  Good  Speech:  Normal Rate  Volume:  Normal  Mood:  improved mood, today seems euthymic, with a fuller range of affect   Affect:  Full Range  Thought Process:  Linear  Orientation:  Full (Time, Place, and Person)  Thought Content:  denies hallucinations, no delusions   Suicidal Thoughts:  No  Homicidal Thoughts:  No  Memory:  recent and remote grossly intact   Judgement:  Other:  improved   Insight:  Present  Psychomotor Activity:  Normal  Concentration:  Good  Recall:  Good  Fund of Knowledge:Good  Language: Good  Akathisia:  NA  Handed:  Right  AIMS (if indicated):     Assets:  Desire for Improvement Resilience  Sleep:  Number of Hours: 6.25  Cognition: WNL  ADL's: improving   Have you used any form of tobacco in the last 30 days? (Cigarettes, Smokeless Tobacco, Cigars, and/or Pipes): Yes  Has this patient used any form of tobacco in the last 30 days? (Cigarettes, Smokeless Tobacco, Cigars, and/or Pipes) No  Mental Status Per Nursing Assessment::   On Admission:  NA  Current Mental Status by Physician: At this time patient is much improved. She had been quite anxious about disposition planning and as noted, has been homeless recently. Being accepted to [facility] has helped her feel better and more optimistic . At this time euthymic, full range of affect, no thought disorder, no SI or HI, no  psychotic symptoms, future oriented   Loss Factors: Homelessness, poor support system  Historical Factors: Alcohol, Cocaine Dependencies, Depression  Risk Reduction Factors:   Sense of responsibility to family and Positive coping skills or problem solving skills  Continued Clinical Symptoms:  As noted, at this time much improved, with improved mood and range of affect, currently euthymic, no current SI or HI, no psychotic symptoms.  Cognitive Features That Contribute To Risk:  No gross cognitive deficits noted upon discharge. Is alert , attentive, and oriented x 3   Suicide Risk:  Mild:  Suicidal ideation of limited frequency, intensity, duration, and specificity.  There are no identifiable plans, no associated intent, mild dysphoria and related symptoms, good self-control (both objective and subjective assessment), few other risk factors, and identifiable protective factors, including available and accessible social support.  Principal Problem: MDD (major depressive disorder), recurrent severe, without psychosis Discharge Diagnoses:  Patient Active Problem List   Diagnosis Date Noted  . Alcohol use disorder, severe, dependence [F10.20] 02/25/2014  . Cocaine use disorder, moderate, dependence [F14.20] 02/25/2014  . MDD (major depressive disorder), recurrent severe, without psychosis [F33.2] 02/25/2014  . Bed bug bite [T14.8, W57.XXXA] 02/23/2014    Follow-up Information    Follow up with [facility] On 03/01/2014.   Why:  Please present to [facility] today for continued treatment. Please call office if you need to reschedule.   Contact information:   [address]  [place], [place] [address] [phone]  Plan Of Care/Follow-up recommendations:  Activity:  As tolerated Diet:  Regular Tests:  NA Other:  See below  Is patient on multiple antipsychotic therapies at discharge:  No   Has Patient had three or more failed trials of antipsychotic monotherapy by history:   No  Recommended Plan for Multiple Antipsychotic Therapies: NA  Patient is leaving unit in good spirits. Follow up as above.   COBOS, FERNANDO 03/01/2014, 11:48 AM

## 2014-03-04 NOTE — Progress Notes (Signed)
Patient Discharge Instructions:  [facility].  Per the Sw the information was already sent.  Jerelene ReddenSheena E Loraine, 03/04/2014, 3:29 PM

## 2014-07-04 IMAGING — CT CT ABD-PELV W/O CM
2 of 3 series · 12 of 36 positions shown, 15 images · non-contrast
Comparison: CT ABD/PELV WO CM dated 05/03/2013

CLINICAL DATA: Right flank pain.  History of renal calculi

EXAM:
CT ABDOMEN AND PELVIS WITHOUT CONTRAST
TECHNIQUE: Multidetector CT imaging of the abdomen and pelvis was performed
following the standard protocol without intravenous contrast.

[Series 201: stone study, idose (3) · axial · 0.82mm/px · z∈[-514,-99]mm · 11 of 97 slices shown, 13 images]
[im 9/97  soft-tissue]
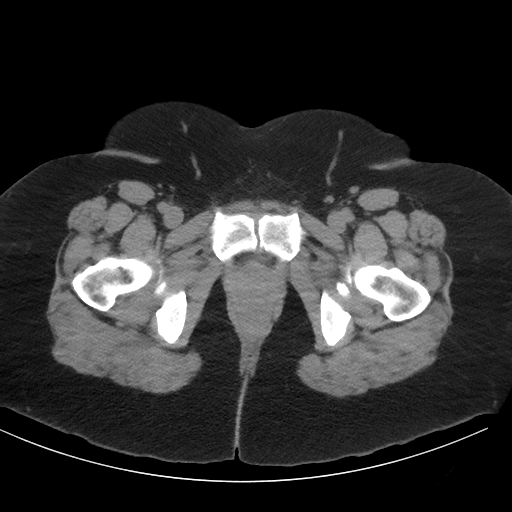
[im 9/97  bone]
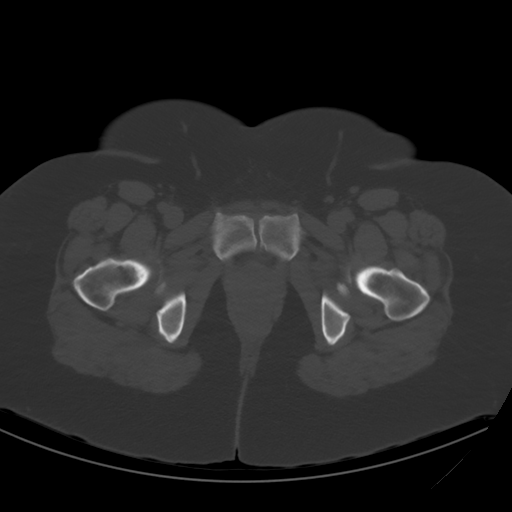
[im 21/97  soft-tissue]
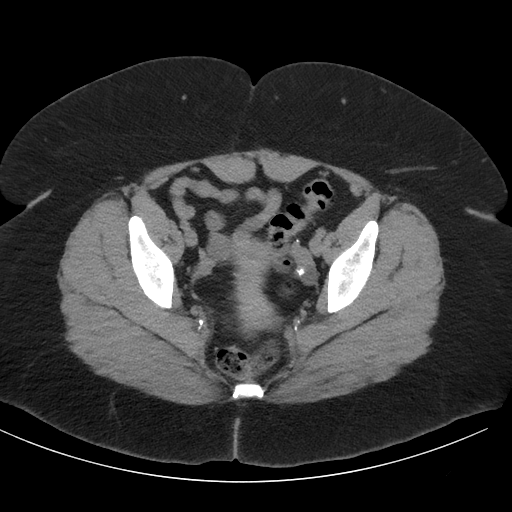
[im 30/97  soft-tissue]
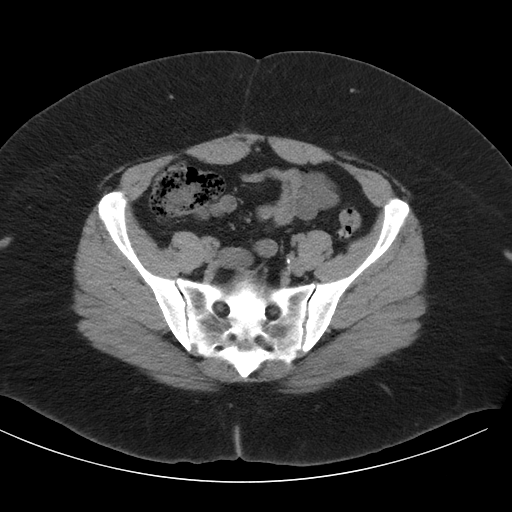
[im 42/97  soft-tissue]
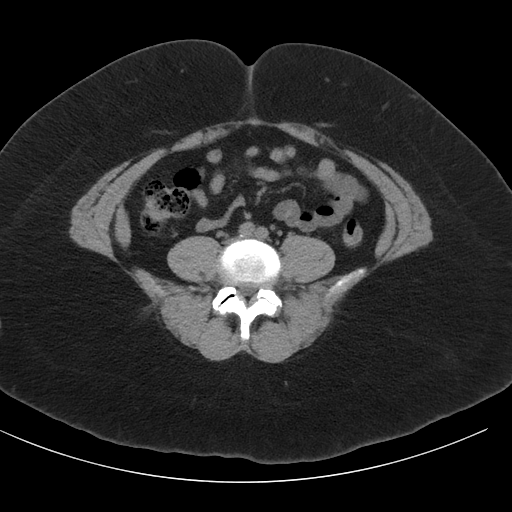
[im 55/97  soft-tissue]
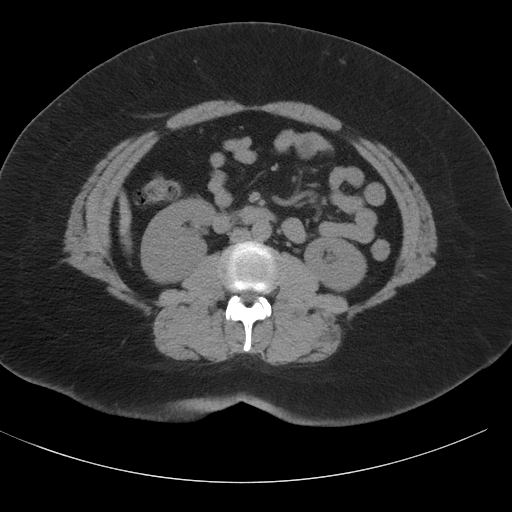
[im 67/97  soft-tissue]
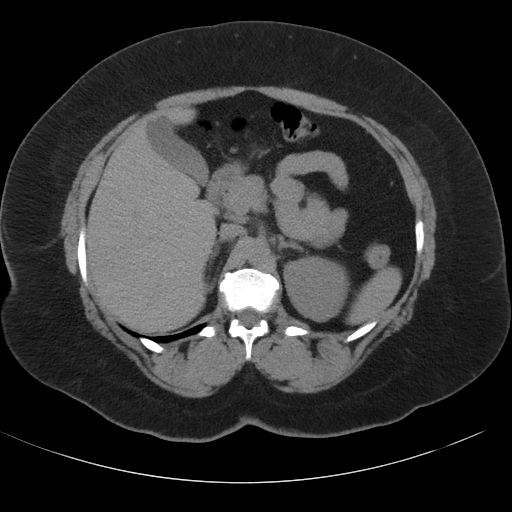
[im 76/97  soft-tissue]
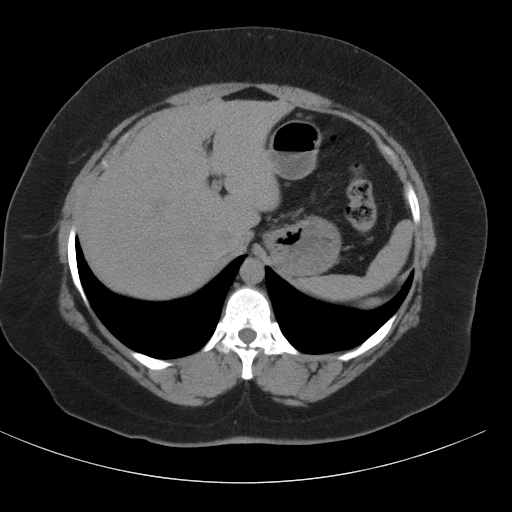
[im 80/97  lung]
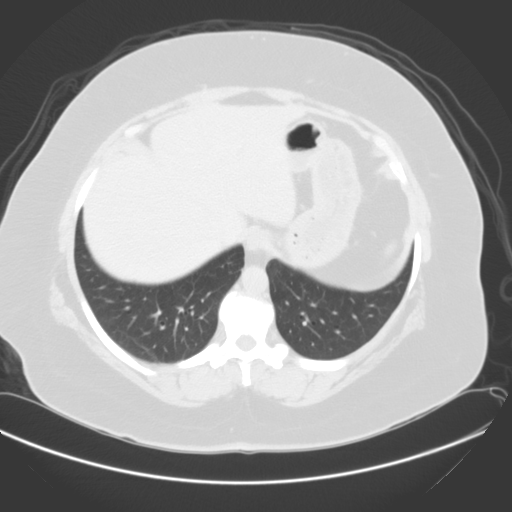
[im 84/97  lung]
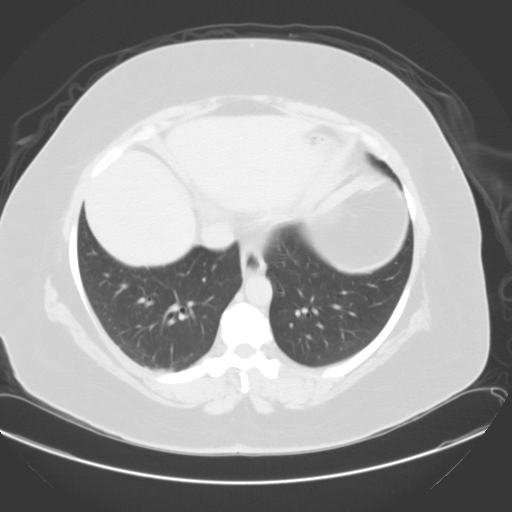
[im 88/97  soft-tissue]
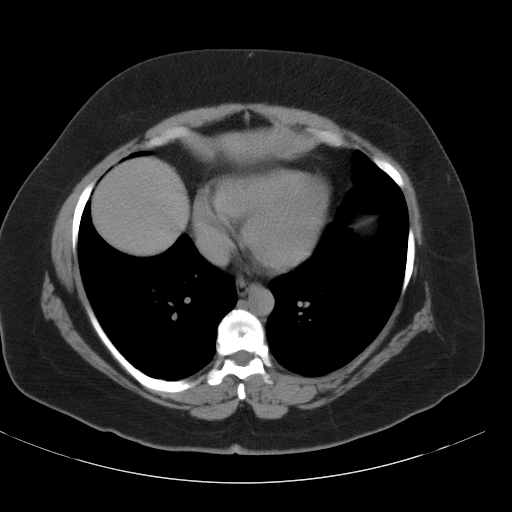
[im 88/97  lung]
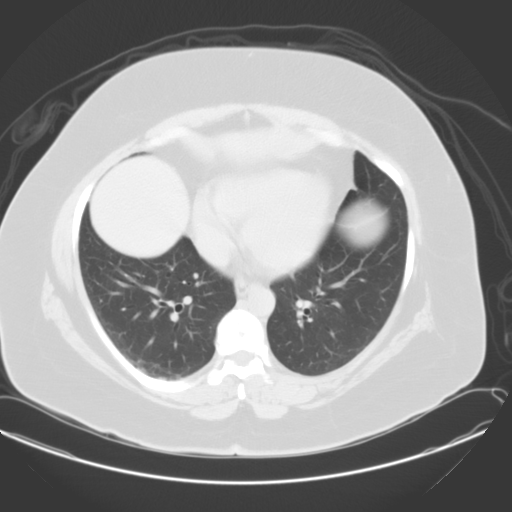
[im 92/97  lung]
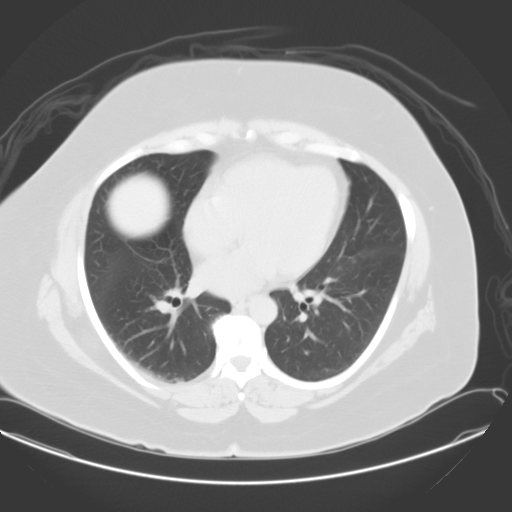

[Series 204: sagittals, idose (3) · sagittal · 0.50mm/px · 1 of 168 slices shown, 2 images]
[im 56/168  soft-tissue]
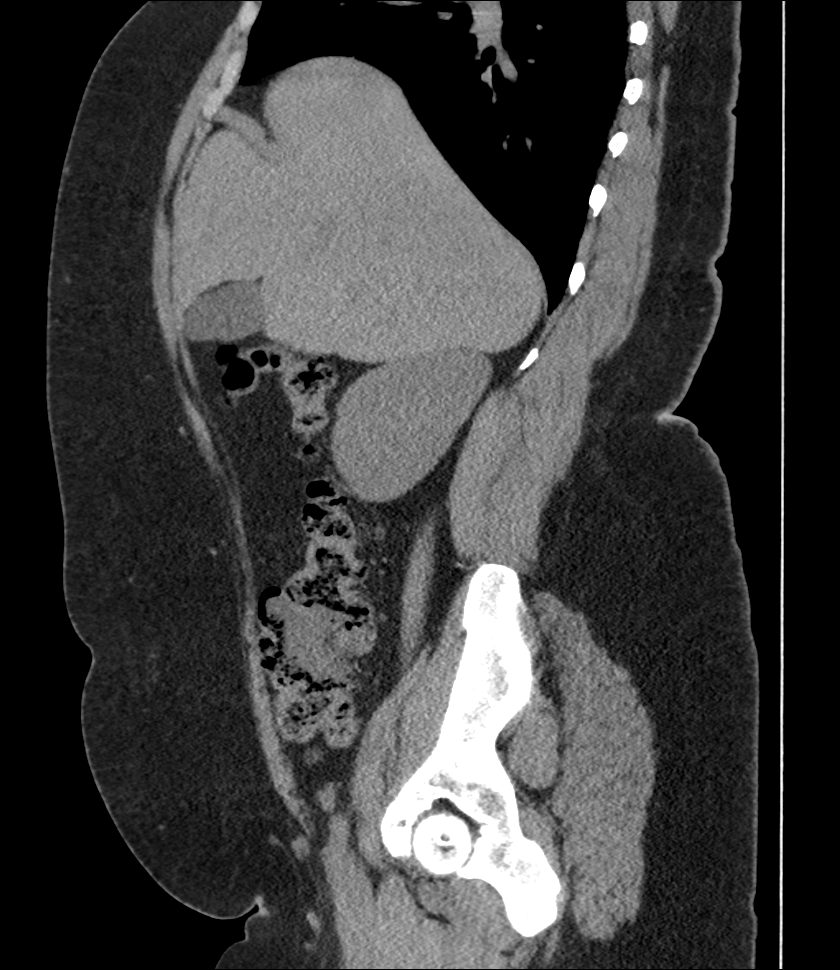
[im 56/168  bone]
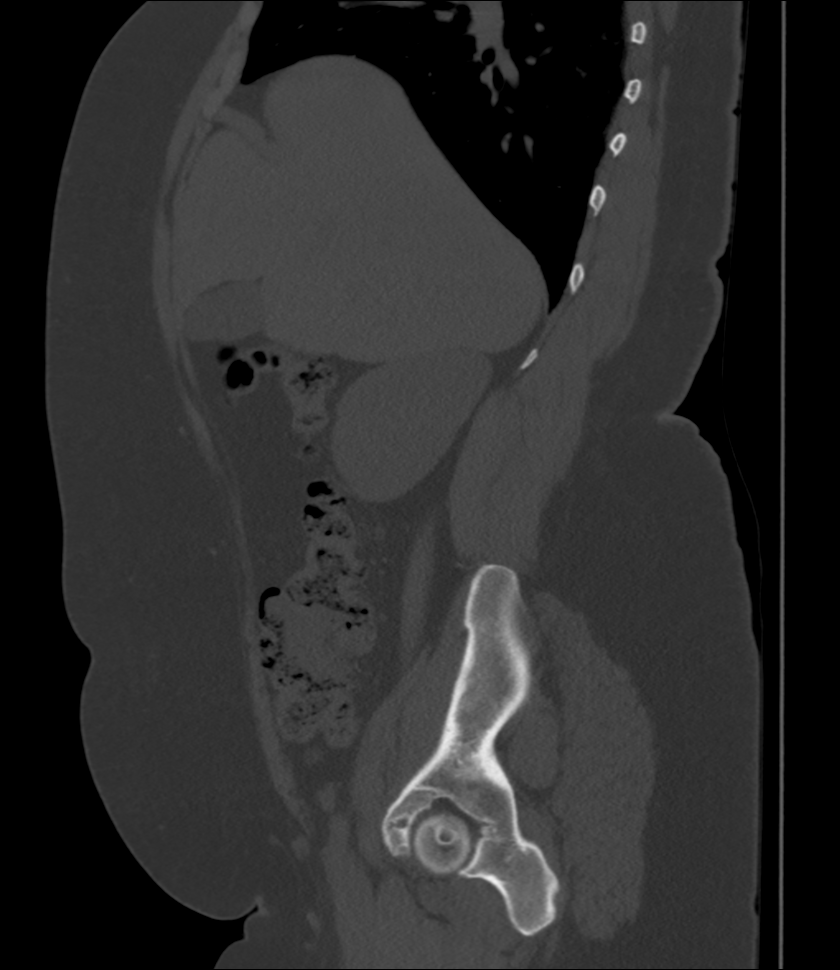

[12 of 36 positions shown; findings below may reference images not displayed]

FINDINGS: Renal: There is a nonobstructing 3 mm calculus in lower pole of the
right kidney not changed from prior. Note ureterolithiasis or
obstructive uropathy.

Lung bases are clear.  No pericardial fluid.

Non IV contrast images demonstrate no focal hepatic lesion. The
gallbladder, pancreas, spleen, and adrenal glands are normal.

Stomach, small bowel, cecum are normal. The appendix is not
identified and may be surgically absent. The colon and rectosigmoid
colon are normal.

Abdominal aorta is normal caliber. No retroperitoneal periportal
lymphadenopathy.

No free fluid the pelvis. No distal ureteral stones or bladder
stones. The uterus and ovaries are unchanged. There are
calcifications associated with the left ovary.

No pelvic adenopathy.  No aggressive osseous lesion.
IMPRESSION: 1. No change in right renal calculus.
2. No ureterolithiasis or obstructive uropathy.
